# Patient Record
Sex: Female | Born: 1980 | Race: White | Hispanic: No | Marital: Married | Smoking: Never smoker
Health system: Southern US, Community
[De-identification: ages and names within clinical notes are randomized; demographics above are authoritative.]

## PROBLEM LIST (undated history)

## (undated) DIAGNOSIS — E079 Disorder of thyroid, unspecified: Secondary | ICD-10-CM

## (undated) DIAGNOSIS — F329 Major depressive disorder, single episode, unspecified: Secondary | ICD-10-CM

## (undated) DIAGNOSIS — F32A Depression, unspecified: Secondary | ICD-10-CM

## (undated) DIAGNOSIS — M069 Rheumatoid arthritis, unspecified: Secondary | ICD-10-CM

## (undated) DIAGNOSIS — M1990 Unspecified osteoarthritis, unspecified site: Secondary | ICD-10-CM

## (undated) DIAGNOSIS — C73 Malignant neoplasm of thyroid gland: Secondary | ICD-10-CM

## (undated) DIAGNOSIS — K219 Gastro-esophageal reflux disease without esophagitis: Secondary | ICD-10-CM

## (undated) HISTORY — DX: Malignant neoplasm of thyroid gland: C73

## (undated) HISTORY — DX: Gastro-esophageal reflux disease without esophagitis: K21.9

## (undated) HISTORY — PX: TYMPANOPLASTY: SHX33

## (undated) HISTORY — PX: THYROID SURGERY: SHX805

## (undated) HISTORY — DX: Rheumatoid arthritis, unspecified: M06.9

## (undated) HISTORY — PX: OTHER SURGICAL HISTORY: SHX169

---

## 2016-06-03 ENCOUNTER — Emergency Department (HOSPITAL_COMMUNITY)
Admission: EM | Admit: 2016-06-03 | Discharge: 2016-06-03 | Disposition: A | Discharge status: Home / Self Care | Payer: BLUE CROSS/BLUE SHIELD | Attending: Emergency Medicine | Admitting: Emergency Medicine

## 2016-06-03 ENCOUNTER — Encounter (HOSPITAL_COMMUNITY): Payer: Self-pay | Admitting: Emergency Medicine

## 2016-06-03 DIAGNOSIS — Y939 Activity, unspecified: Secondary | ICD-10-CM | POA: Insufficient documentation

## 2016-06-03 DIAGNOSIS — M542 Cervicalgia: Secondary | ICD-10-CM | POA: Insufficient documentation

## 2016-06-03 DIAGNOSIS — M549 Dorsalgia, unspecified: Secondary | ICD-10-CM | POA: Diagnosis not present

## 2016-06-03 DIAGNOSIS — Y92410 Unspecified street and highway as the place of occurrence of the external cause: Secondary | ICD-10-CM | POA: Diagnosis not present

## 2016-06-03 DIAGNOSIS — Y999 Unspecified external cause status: Secondary | ICD-10-CM | POA: Insufficient documentation

## 2016-06-03 HISTORY — DX: Depression, unspecified: F32.A

## 2016-06-03 HISTORY — DX: Unspecified osteoarthritis, unspecified site: M19.90

## 2016-06-03 HISTORY — DX: Disorder of thyroid, unspecified: E07.9

## 2016-06-03 HISTORY — DX: Major depressive disorder, single episode, unspecified: F32.9

## 2016-06-03 MED ORDER — DICLOFENAC 18 MG PO CAPS: 18.0000 mg | ORAL_CAPSULE | Freq: Three times a day (TID) | ORAL | 0 refills | Status: DC | PRN

## 2016-06-03 MED ORDER — LIDOCAINE 5 % EX PTCH: 1.0000 | MEDICATED_PATCH | CUTANEOUS | 0 refills | Status: DC

## 2016-06-03 MED ORDER — METHOCARBAMOL 500 MG PO TABS: 500.0000 mg | ORAL_TABLET | Freq: Two times a day (BID) | ORAL | 0 refills | Status: DC
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
## 2016-06-03 NOTE — Discharge Instructions (Signed)
Expect your soreness to increase over the next 2-3 days. Take it easy, but do not lay around too much as this may make any stiffness worse. Take 500 mg of naproxen every 12 hours or 800 mg of ibuprofen every 8 hours for the next 3 days (if ibuprofen or naproxen are not an option, you may take 18 mg of diclofenac 3 times a day for the next 3 days). Take these medications with food to avoid upset stomach. Robaxin is a muscle relaxer and may help loosen stiff muscles. Do not take the Robaxin while driving or performing other dangerous activities. Be sure to perform the attached exercises starting with three times a week and working up to performing them daily. This is an essential part of preventing long term problems. Follow up with a primary care provider for any future management of these complaints.
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
## 2016-08-31 DIAGNOSIS — M0579 Rheumatoid arthritis with rheumatoid factor of multiple sites without organ or systems involvement: Secondary | ICD-10-CM | POA: Diagnosis not present

## 2016-08-31 DIAGNOSIS — G43009 Migraine without aura, not intractable, without status migrainosus: Secondary | ICD-10-CM | POA: Diagnosis not present

## 2016-09-13 DIAGNOSIS — M0579 Rheumatoid arthritis with rheumatoid factor of multiple sites without organ or systems involvement: Secondary | ICD-10-CM | POA: Diagnosis not present

## 2016-10-12 DIAGNOSIS — M0579 Rheumatoid arthritis with rheumatoid factor of multiple sites without organ or systems involvement: Secondary | ICD-10-CM | POA: Diagnosis not present

## 2016-10-12 DIAGNOSIS — Z79899 Other long term (current) drug therapy: Secondary | ICD-10-CM | POA: Diagnosis not present

## 2016-11-01 DIAGNOSIS — M0579 Rheumatoid arthritis with rheumatoid factor of multiple sites without organ or systems involvement: Secondary | ICD-10-CM | POA: Diagnosis not present

## 2016-11-01 DIAGNOSIS — G43009 Migraine without aura, not intractable, without status migrainosus: Secondary | ICD-10-CM | POA: Diagnosis not present

## 2016-12-07 DIAGNOSIS — M0579 Rheumatoid arthritis with rheumatoid factor of multiple sites without organ or systems involvement: Secondary | ICD-10-CM | POA: Diagnosis not present

## 2016-12-07 DIAGNOSIS — Z79899 Other long term (current) drug therapy: Secondary | ICD-10-CM | POA: Diagnosis not present

## 2017-02-01 DIAGNOSIS — M0579 Rheumatoid arthritis with rheumatoid factor of multiple sites without organ or systems involvement: Secondary | ICD-10-CM | POA: Diagnosis not present

## 2017-02-01 DIAGNOSIS — G43009 Migraine without aura, not intractable, without status migrainosus: Secondary | ICD-10-CM | POA: Diagnosis not present

## 2017-02-04 DIAGNOSIS — M0579 Rheumatoid arthritis with rheumatoid factor of multiple sites without organ or systems involvement: Secondary | ICD-10-CM | POA: Diagnosis not present

## 2017-02-04 DIAGNOSIS — Z79899 Other long term (current) drug therapy: Secondary | ICD-10-CM | POA: Diagnosis not present

## 2017-02-17 DIAGNOSIS — G43009 Migraine without aura, not intractable, without status migrainosus: Secondary | ICD-10-CM | POA: Diagnosis not present

## 2017-02-17 DIAGNOSIS — M0579 Rheumatoid arthritis with rheumatoid factor of multiple sites without organ or systems involvement: Secondary | ICD-10-CM | POA: Diagnosis not present

## 2017-04-08 DIAGNOSIS — Z79899 Other long term (current) drug therapy: Secondary | ICD-10-CM | POA: Diagnosis not present

## 2017-04-08 DIAGNOSIS — M0579 Rheumatoid arthritis with rheumatoid factor of multiple sites without organ or systems involvement: Secondary | ICD-10-CM | POA: Diagnosis not present

## 2017-04-14 DIAGNOSIS — F4321 Adjustment disorder with depressed mood: Secondary | ICD-10-CM | POA: Diagnosis not present

## 2017-04-21 DIAGNOSIS — F4321 Adjustment disorder with depressed mood: Secondary | ICD-10-CM | POA: Diagnosis not present

## 2017-04-28 DIAGNOSIS — F4321 Adjustment disorder with depressed mood: Secondary | ICD-10-CM | POA: Diagnosis not present

## 2017-05-11 DIAGNOSIS — M0579 Rheumatoid arthritis with rheumatoid factor of multiple sites without organ or systems involvement: Secondary | ICD-10-CM | POA: Diagnosis not present

## 2017-05-12 DIAGNOSIS — F4321 Adjustment disorder with depressed mood: Secondary | ICD-10-CM | POA: Diagnosis not present

## 2017-06-03 DIAGNOSIS — M0579 Rheumatoid arthritis with rheumatoid factor of multiple sites without organ or systems involvement: Secondary | ICD-10-CM | POA: Diagnosis not present

## 2017-06-30 DIAGNOSIS — F4321 Adjustment disorder with depressed mood: Secondary | ICD-10-CM | POA: Diagnosis not present

## 2017-07-12 DIAGNOSIS — F4321 Adjustment disorder with depressed mood: Secondary | ICD-10-CM | POA: Diagnosis not present

## 2017-07-29 DIAGNOSIS — M0579 Rheumatoid arthritis with rheumatoid factor of multiple sites without organ or systems involvement: Secondary | ICD-10-CM | POA: Diagnosis not present

## 2017-08-02 DIAGNOSIS — F4321 Adjustment disorder with depressed mood: Secondary | ICD-10-CM | POA: Diagnosis not present

## 2017-08-11 DIAGNOSIS — F4321 Adjustment disorder with depressed mood: Secondary | ICD-10-CM | POA: Diagnosis not present

## 2017-08-11 DIAGNOSIS — M7989 Other specified soft tissue disorders: Secondary | ICD-10-CM | POA: Diagnosis not present

## 2017-08-11 DIAGNOSIS — M0579 Rheumatoid arthritis with rheumatoid factor of multiple sites without organ or systems involvement: Secondary | ICD-10-CM | POA: Diagnosis not present

## 2017-09-23 DIAGNOSIS — Z79899 Other long term (current) drug therapy: Secondary | ICD-10-CM | POA: Diagnosis not present

## 2017-09-23 DIAGNOSIS — M0579 Rheumatoid arthritis with rheumatoid factor of multiple sites without organ or systems involvement: Secondary | ICD-10-CM | POA: Diagnosis not present

## 2017-11-10 DIAGNOSIS — F4321 Adjustment disorder with depressed mood: Secondary | ICD-10-CM | POA: Diagnosis not present

## 2017-11-18 DIAGNOSIS — M0579 Rheumatoid arthritis with rheumatoid factor of multiple sites without organ or systems involvement: Secondary | ICD-10-CM | POA: Diagnosis not present

## 2017-11-29 DIAGNOSIS — F4321 Adjustment disorder with depressed mood: Secondary | ICD-10-CM | POA: Diagnosis not present

## 2017-12-08 DIAGNOSIS — M0579 Rheumatoid arthritis with rheumatoid factor of multiple sites without organ or systems involvement: Secondary | ICD-10-CM | POA: Diagnosis not present

## 2017-12-13 DIAGNOSIS — F4321 Adjustment disorder with depressed mood: Secondary | ICD-10-CM | POA: Diagnosis not present

## 2018-01-13 DIAGNOSIS — M0579 Rheumatoid arthritis with rheumatoid factor of multiple sites without organ or systems involvement: Secondary | ICD-10-CM | POA: Diagnosis not present

## 2018-02-09 DIAGNOSIS — F4321 Adjustment disorder with depressed mood: Secondary | ICD-10-CM | POA: Diagnosis not present

## 2018-02-16 DIAGNOSIS — F4321 Adjustment disorder with depressed mood: Secondary | ICD-10-CM | POA: Diagnosis not present

## 2018-03-02 DIAGNOSIS — F4321 Adjustment disorder with depressed mood: Secondary | ICD-10-CM | POA: Diagnosis not present

## 2018-03-14 DIAGNOSIS — E039 Hypothyroidism, unspecified: Secondary | ICD-10-CM | POA: Diagnosis not present

## 2018-03-14 DIAGNOSIS — Z79899 Other long term (current) drug therapy: Secondary | ICD-10-CM | POA: Diagnosis not present

## 2018-03-14 DIAGNOSIS — M0579 Rheumatoid arthritis with rheumatoid factor of multiple sites without organ or systems involvement: Secondary | ICD-10-CM | POA: Diagnosis not present

## 2018-03-23 DIAGNOSIS — F4321 Adjustment disorder with depressed mood: Secondary | ICD-10-CM | POA: Diagnosis not present

## 2018-04-19 DIAGNOSIS — Z803 Family history of malignant neoplasm of breast: Secondary | ICD-10-CM | POA: Diagnosis not present

## 2018-04-19 DIAGNOSIS — Z1231 Encounter for screening mammogram for malignant neoplasm of breast: Secondary | ICD-10-CM | POA: Diagnosis not present

## 2018-05-09 DIAGNOSIS — M0579 Rheumatoid arthritis with rheumatoid factor of multiple sites without organ or systems involvement: Secondary | ICD-10-CM | POA: Diagnosis not present

## 2018-06-19 DIAGNOSIS — M0579 Rheumatoid arthritis with rheumatoid factor of multiple sites without organ or systems involvement: Secondary | ICD-10-CM | POA: Diagnosis not present

## 2018-06-29 ENCOUNTER — Encounter (INDEPENDENT_AMBULATORY_CARE_PROVIDER_SITE_OTHER): Payer: Self-pay

## 2018-07-07 DIAGNOSIS — M0579 Rheumatoid arthritis with rheumatoid factor of multiple sites without organ or systems involvement: Secondary | ICD-10-CM | POA: Diagnosis not present

## 2018-07-13 ENCOUNTER — Encounter (INDEPENDENT_AMBULATORY_CARE_PROVIDER_SITE_OTHER): Payer: Self-pay | Admitting: Family Medicine

## 2018-07-13 ENCOUNTER — Ambulatory Visit (INDEPENDENT_AMBULATORY_CARE_PROVIDER_SITE_OTHER): Payer: BLUE CROSS/BLUE SHIELD | Admitting: Family Medicine

## 2018-07-13 VITALS — BP 141/84 | HR 98 | Temp 98.0°F | Ht 66.0 in | Wt 213.0 lb

## 2018-07-13 DIAGNOSIS — M069 Rheumatoid arthritis, unspecified: Secondary | ICD-10-CM | POA: Diagnosis not present

## 2018-07-13 DIAGNOSIS — Z0289 Encounter for other administrative examinations: Secondary | ICD-10-CM

## 2018-07-13 DIAGNOSIS — R5383 Other fatigue: Secondary | ICD-10-CM | POA: Diagnosis not present

## 2018-07-13 DIAGNOSIS — Z9189 Other specified personal risk factors, not elsewhere classified: Secondary | ICD-10-CM | POA: Diagnosis not present

## 2018-07-13 DIAGNOSIS — E559 Vitamin D deficiency, unspecified: Secondary | ICD-10-CM | POA: Diagnosis not present

## 2018-07-13 DIAGNOSIS — Z6834 Body mass index (BMI) 34.0-34.9, adult: Secondary | ICD-10-CM

## 2018-07-13 DIAGNOSIS — F418 Other specified anxiety disorders: Secondary | ICD-10-CM | POA: Diagnosis not present

## 2018-07-13 DIAGNOSIS — E669 Obesity, unspecified: Secondary | ICD-10-CM

## 2018-07-13 DIAGNOSIS — R0602 Shortness of breath: Secondary | ICD-10-CM

## 2018-07-15 ENCOUNTER — Encounter (INDEPENDENT_AMBULATORY_CARE_PROVIDER_SITE_OTHER): Payer: Self-pay | Admitting: Family Medicine

## 2018-07-15 LAB — T4, FREE: Free T4: 1.64 ng/dL (ref 0.82–1.77)

## 2018-07-15 LAB — VITAMIN B12: Vitamin B-12: >2000 pg/mL — ABNORMAL HIGH (ref 232–1245)

## 2018-07-15 LAB — COMPREHENSIVE METABOLIC PANEL
ALBUMIN: 4.3 g/dL (ref 3.8–4.8)
ALT: 18 IU/L (ref 0–32)
AST: 17 IU/L (ref 0–40)
Albumin/Globulin Ratio: 1.6 (ref 1.2–2.2)
Alkaline Phosphatase: 98 IU/L (ref 39–117)
BILIRUBIN TOTAL: 0.4 mg/dL (ref 0.0–1.2)
BUN/Creatinine Ratio: 13 (ref 9–23)
BUN: 10 mg/dL (ref 6–20)
CHLORIDE: 102 mmol/L (ref 96–106)
CO2: 19 mmol/L — ABNORMAL LOW (ref 20–29)
Calcium: 8.5 mg/dL — ABNORMAL LOW (ref 8.7–10.2)
Creatinine, Ser: 0.76 mg/dL (ref 0.57–1.00)
GFR, EST AFRICAN AMERICAN: 116 mL/min/{1.73_m2} (ref >59)
GFR, EST NON AFRICAN AMERICAN: 101 mL/min/{1.73_m2} (ref >59)
Globulin, Total: 2.7 g/dL (ref 1.5–4.5)
Glucose: 79 mg/dL (ref 65–99)
Potassium: 3.9 mmol/L (ref 3.5–5.2)
Sodium: 140 mmol/L (ref 134–144)
TOTAL PROTEIN: 7 g/dL (ref 6.0–8.5)

## 2018-07-15 LAB — CBC WITH DIFFERENTIAL
BASOS: 1 %
Basophils Absolute: 0 10*3/uL (ref 0.0–0.2)
EOS (ABSOLUTE): 0.1 10*3/uL (ref 0.0–0.4)
Eos: 2 %
Hematocrit: 38.1 % (ref 34.0–46.6)
Hemoglobin: 13.2 g/dL (ref 11.1–15.9)
IMMATURE GRANS (ABS): 0 10*3/uL (ref 0.0–0.1)
Immature Granulocytes: 0 %
LYMPHS ABS: 2 10*3/uL (ref 0.7–3.1)
LYMPHS: 28 %
MCH: 30.6 pg (ref 26.6–33.0)
MCHC: 34.6 g/dL (ref 31.5–35.7)
MCV: 88 fL (ref 79–97)
Monocytes Absolute: 0.4 10*3/uL (ref 0.1–0.9)
Monocytes: 6 %
NEUTROS ABS: 4.7 10*3/uL (ref 1.4–7.0)
NEUTROS PCT: 63 %
RBC: 4.31 x10E6/uL (ref 3.77–5.28)
RDW: 11.9 % (ref 11.7–15.4)
WBC: 7.3 10*3/uL (ref 3.4–10.8)

## 2018-07-15 LAB — HEMOGLOBIN A1C
ESTIMATED AVERAGE GLUCOSE: 103 mg/dL
HEMOGLOBIN A1C: 5.2 % (ref 4.8–5.6)

## 2018-07-15 LAB — LIPID PANEL WITH LDL/HDL RATIO
Cholesterol, Total: 168 mg/dL (ref 100–199)
HDL: 40 mg/dL (ref >39)
LDL Calculated: 117 mg/dL — ABNORMAL HIGH (ref 0–99)
LDl/HDL Ratio: 2.9 ratio (ref 0.0–3.2)
Triglycerides: 56 mg/dL (ref 0–149)
VLDL Cholesterol Cal: 11 mg/dL (ref 5–40)

## 2018-07-15 LAB — FOLATE: Folate: >20 ng/mL (ref >3.0)

## 2018-07-15 LAB — TSH: TSH: 0.477 u[IU]/mL (ref 0.450–4.500)

## 2018-07-15 LAB — INSULIN, RANDOM: INSULIN: 15.4 u[IU]/mL (ref 2.6–24.9)

## 2018-07-15 LAB — VITAMIN D 25 HYDROXY (VIT D DEFICIENCY, FRACTURES): Vit D, 25-Hydroxy: 56.7 ng/mL (ref 30.0–100.0)

## 2018-07-15 LAB — T3: T3 TOTAL: 116 ng/dL (ref 71–180)

## 2018-07-16 NOTE — Progress Notes (Signed)
.  Office: [phone]  /  Fax: [phone]   HPI:   Chief Complaint: OBESITY  Briana Brown (MR# [id number]) is a 38 y.o. female who presents on 07/13/2018 for obesity evaluation and treatment. Current BMI is Body mass index is 34.38 kg/m.Marland Kitchen Briana Brown has struggled with obesity for years and has been unsuccessful in either losing weight or maintaining long term weight loss. Briana Brown attended our information session and states she is currently in the action stage of change and ready to dedicate time achieving and maintaining a healthier weight.   Briana Brown heard about our clinic from a friend.  Briana Brown states her family eats meals together she thinks her family will eat healthier with  her her desired weight loss is 38-63 lbs she started gaining weight in 2014-post nursing her heaviest weight ever was 220 lbs she is a picky eater and doesn't like to eat healthier foods  she has significant food cravings issues  she snacks frequently in the evenings she skips meals frequently she is frequently drinking liquids with calories she frequently makes poor food choices she struggles with emotional eating    Fatigue Briana Brown feels her energy is lower than it should be. This has worsened with weight gain and has not worsened recently. Briana Brown admits to daytime somnolence and  admits to waking up still tired. Patient is at risk for obstructive sleep apnea. Patent has a history of symptoms of daytime fatigue. Patient generally gets 7 hours of sleep per night, and states they generally have generally restful sleep. Snoring is present. Apneic episodes are not present. Epworth Sleepiness Score is 11.  Dyspnea on exertion Briana Brown notes increasing shortness of breath with exercising and seems to be worsening over time with weight gain. She notes getting out of breath sooner with activity than she used to. This has not gotten worse recently. Briana Brown denies orthopnea.  Rheumatoid Arthritis Briana Brown has a  diagnosis of rheumatoid arthritis. She sees Dr. Georgetta Haber at [facility]. She notes having increased fatigue.  Vitamin D Deficiency Briana Brown has a diagnosis of vitamin D deficiency. She is OTC Vit D supplement and denies nausea, vomiting or muscle weakness.  At risk for osteopenia and osteoporosis Briana Brown is at higher risk of osteopenia and osteoporosis due to vitamin D deficiency.   Depression with Anxiety Briana Brown is on Wellbutrin XL, and her symptoms are managed and controlled. Briana Brown struggles with emotional eating and using food for comfort to the extent that it is negatively impacting her health. She often snacks when she is not hungry. Briana Brown sometimes feels she is out of control and then feels guilty that she made poor food choices. She has been working on behavior modification techniques to help reduce her emotional eating and has been somewhat successful. She shows no sign of suicidal or homicidal ideations.  Depression Screen Briana Brown's Food and Mood (modified PHQ-9) score was  Depression screen PHQ 2/9 07/13/2018  Decreased Interest 2  Down, Depressed, Hopeless 1  PHQ - 2 Score 3  Altered sleeping 1  Tired, decreased energy 2  Change in appetite 3  Feeling bad or failure about yourself  0  Trouble concentrating 2  Moving slowly or fidgety/restless 0  Suicidal thoughts 0  PHQ-9 Score 11    ASSESSMENT AND PLAN:  Other fatigue - Plan: EKG 12-Lead, Vitamin B12, CBC With Differential, Comprehensive metabolic panel, Folate, Hemoglobin A1c, Insulin, random, T3, T4, free, TSH  Shortness of breath on exertion - Plan: Lipid Panel With LDL/HDL Ratio  Rheumatoid arthritis, involving  unspecified site, unspecified rheumatoid factor presence ([place]) - Plan: VITAMIN D 25 Hydroxy (Vit-D Deficiency, Fractures)  Depression with anxiety  At risk for osteoporosis  Class 1 obesity with serious comorbidity and body mass index (BMI) of 34.0 to 34.9 in adult, unspecified obesity  type  PLAN:  Fatigue Briana Brown was informed that her fatigue may be related to obesity, depression or many other causes. Labs will be ordered, and in the meanwhile Briana Brown has agreed to work on diet, exercise and weight loss to help with fatigue. Proper sleep hygiene was discussed including the need for 7-8 hours of quality sleep each night. A sleep study was not ordered based on symptoms and Epworth score.  Dyspnea on exertion Briana Brown's shortness of breath appears to be obesity related and exercise induced. She has agreed to work on weight loss and gradually increase exercise to treat her exercise induced shortness of breath. If Briana Brown follows our instructions and loses weight without improvement of her shortness of breath, we will plan to refer to pulmonology. We will monitor this condition regularly. [name] agrees to this plan.  Rheumatoid Arthritis Briana Brown will follow up with rheumatology as needed, and she agrees to follow up with our clinic in 2 weeks.  Vitamin D Deficiency Briana Brown was informed that low vitamin D levels contributes to fatigue and are associated with obesity, breast, and colon cancer. Briana Brown agrees to continue taking OTC Vit D supplement and will follow up for routine testing of vitamin D, at least 2-3 times per year. She was informed of the risk of over-replacement of vitamin D and agrees to not increase her dose unless she discusses this with [name] first. We will check Vit D level today. Briana Brown agrees to follow up with our clinic in 2 weeks.  At risk for osteopenia and osteoporosis Briana Brown was given extended (15 minutes) osteoporosis prevention counseling today. Briana Brown is at risk for osteopenia and osteoporsis due to her vitamin D deficiency. She was encouraged to take her vitamin D and follow her higher calcium diet and increase strengthening exercise to help strengthen her bones and decrease her risk of osteopenia and osteoporosis.  Depression with Anxiety We discussed  behavior modification techniques today to help Briana Brown deal with her anxiety, emotional eating, and depression. Briana Brown agrees to continue taking Wellbutrin XL and she will follow up with her primary care physician as needed. Briana Brown agrees to follow up with our clinic in 2 weeks.  Depression Screen Briana Brown had a moderately positive depression screening. Depression is commonly associated with obesity and often results in emotional eating behaviors. We will monitor this closely and work on CBT to help improve the non-hunger eating patterns. Referral to Psychology may be required if no improvement is seen as she continues in our clinic.  Obesity Briana Brown is currently in the action stage of change and her goal is to continue with weight loss efforts She has agreed to follow the Category 2 plan + 100 calories Briana Brown has been instructed to work up to a goal of 150 minutes of combined cardio and strengthening exercise per week for weight loss and overall health benefits. We discussed the following Behavioral Modification Strategies today: increasing lean protein intake, increasing vegetables,  work on meal planning and easy cooking plans, and planning for success  Briana Brown has agreed to follow up with our clinic in 2 weeks. She was informed of the importance of frequent follow up visits to maximize her success with intensive lifestyle modifications for her multiple health conditions. She was informed  we would discuss her lab results at her next visit unless there is a critical issue that needs to be addressed sooner. Briana Brown agreed to keep her next visit at the agreed upon time to discuss these results.  ALLERGIES: Allergies  Allergen Reactions  . Shellfish Allergy Nausea And Vomiting  . Soy Allergy   . Sulfa Antibiotics Nausea And Vomiting    MEDICATIONS: Current Outpatient Medications on File Prior to Visit  Medication Sig Dispense Refill  . b complex vitamins tablet Take 1 tablet by mouth daily.     . Biotin 5000 MCG TABS Take 1 tablet by mouth daily.    Marland Kitchen buPROPion (WELLBUTRIN XL) 150 MG 24 hr tablet Take 150 mg by mouth daily.    . celecoxib (CELEBREX) 200 MG capsule Take 200 mg by mouth 2 (two) times daily.    . cholecalciferol (VITAMIN D3) 25 MCG (1000 UT) tablet Take 1,000 Units by mouth daily.    . folic acid (FOLVITE) 1 MG tablet Take 1 mg by mouth daily.    . Golimumab 50 MG/0.5ML SOAJ Inject into the skin. 2 mg/kg inj every 8 weeks    . Hyaluronic Acid-Vitamin C (HYALURONIC ACID PO) Take 120 mg by mouth daily.    Marland Kitchen levothyroxine (SYNTHROID, LEVOTHROID) 150 MCG tablet Take 150 mcg by mouth daily before breakfast.    . loratadine (CLARITIN) 10 MG tablet Take 10 mg by mouth daily.    . Magnesium 200 MG TABS Take 1 tablet by mouth daily.    . Melatonin 5 MG TABS Take 1 tablet by mouth daily.    . methotrexate 2.5 MG tablet Take by mouth once a week.    . Multiple Vitamins-Minerals (MULTIVITAMIN WITH MINERALS) tablet Take 1 tablet by mouth daily.     No current facility-administered medications on file prior to visit.     PAST MEDICAL HISTORY: Past Medical History:  Diagnosis Date  . Arthritis   . Depression   . Depression   . GERD (gastroesophageal reflux disease)   . Rheumatoid arthritis ([place])   . Thyroid cancer ([place])   . Thyroid disease     PAST SURGICAL HISTORY: Past Surgical History:  Procedure Laterality Date  . THYROID SURGERY    . TYMPANOPLASTY    . uterine ablation      SOCIAL HISTORY: Social History   Tobacco Use  . Smoking status: Never Smoker  . Smokeless tobacco: Never Used  Substance Use Topics  . Alcohol use: Not Currently  . Drug use: Never    FAMILY HISTORY: Family History  Problem Relation Age of Onset  . Cancer Mother   . Thyroid disease Mother   . Hyperlipidemia Father   . Depression Father     ROS: Review of Systems  Constitutional: Positive for malaise/fatigue. Negative for weight loss.       + Trouble sleeping  HENT:  Positive for tinnitus.   Eyes:       + Wear glasses or contacts  Respiratory: Positive for shortness of breath (with exertion).   Cardiovascular: Negative for orthopnea.       + Very cold feet or hands  Gastrointestinal: Positive for heartburn.  Musculoskeletal:       + Red or swollen joints  Skin:       + Dryness  Endo/Heme/Allergies: Bruises/bleeds easily.       + Heat/cold intolerance  Psychiatric/Behavioral: Positive for depression. Negative for suicidal ideas.    PHYSICAL EXAM: Blood pressure (!) 141/84, pulse 98, temperature  98 F (36.7 C), temperature source Oral, height 5\' 6"  (1.676 m), weight 213 lb (96.6 kg), SpO2 99 %. Body mass index is 34.38 kg/m. Physical Exam Vitals signs reviewed.  Constitutional:      Appearance: Normal appearance. She is obese.  HENT:     Head: Normocephalic and atraumatic.     Nose: Nose normal.  Eyes:     General: No scleral icterus.    Extraocular Movements: Extraocular movements intact.  Neck:     Musculoskeletal: Normal range of motion and neck supple.     Comments: No thyromegaly present Cardiovascular:     Rate and Rhythm: Normal rate and regular rhythm.     Pulses: Normal pulses.     Heart sounds: Normal heart sounds.  Pulmonary:     Effort: Pulmonary effort is normal. No respiratory distress.     Breath sounds: Normal breath sounds.  Abdominal:     Palpations: Abdomen is soft.     Tenderness: There is no abdominal tenderness.     Comments: + Obesity  Musculoskeletal: Normal range of motion.     Right lower leg: No edema.     Left lower leg: No edema.  Skin:    General: Skin is warm and dry.  Neurological:     Mental Status: She is alert and oriented to person, place, and time.     Coordination: Coordination normal.  Psychiatric:        Mood and Affect: Mood normal.        Behavior: Behavior normal.     RECENT LABS AND TESTS: BMET    Component Value Date/Time   NA 140 07/13/2018 1401   K 3.9 07/13/2018 1401    CL 102 07/13/2018 1401   CO2 19 (L) 07/13/2018 1401   GLUCOSE 79 07/13/2018 1401   BUN 10 07/13/2018 1401   CREATININE 0.76 07/13/2018 1401   CALCIUM 8.5 (L) 07/13/2018 1401   GFRNONAA 101 07/13/2018 1401   GFRAA 116 07/13/2018 1401   Lab Results  Component Value Date   HGBA1C 5.2 07/13/2018   Lab Results  Component Value Date   INSULIN 15.4 07/13/2018   CBC    Component Value Date/Time   WBC 7.3 07/13/2018 1401   RBC 4.31 07/13/2018 1401   HGB 13.2 07/13/2018 1401   HCT 38.1 07/13/2018 1401   MCV 88 07/13/2018 1401   MCH 30.6 07/13/2018 1401   MCHC 34.6 07/13/2018 1401   RDW 11.9 07/13/2018 1401   LYMPHSABS 2.0 07/13/2018 1401   EOSABS 0.1 07/13/2018 1401   BASOSABS 0.0 07/13/2018 1401   Iron/TIBC/Ferritin/ %Sat No results found for: IRON, TIBC, FERRITIN, IRONPCTSAT Lipid Panel     Component Value Date/Time   CHOL 168 07/13/2018 1401   TRIG 56 07/13/2018 1401   HDL 40 07/13/2018 1401   LDLCALC 117 (H) 07/13/2018 1401   Hepatic Function Panel     Component Value Date/Time   PROT 7.0 07/13/2018 1401   ALBUMIN 4.3 07/13/2018 1401   AST 17 07/13/2018 1401   ALT 18 07/13/2018 1401   ALKPHOS 98 07/13/2018 1401   BILITOT 0.4 07/13/2018 1401      Component Value Date/Time   TSH 0.477 07/13/2018 1401    ECG  shows NSR with a rate of 94 BPM INDIRECT CALORIMETER done today shows a VO2 of 226 and a REE of 1574. Her calculated basal metabolic rate is 0737 thus her basal metabolic rate is worse than expected.  OBESITY BEHAVIORAL INTERVENTION VISIT  Today's visit was # 1   Starting weight: 213 lbs Starting date: 07/13/2018 Today's weight : 213 lbs  Today's date: 07/13/2018 Total lbs lost to date: 0    ASK: We discussed the diagnosis of obesity with [name] today and Kylie agreed to give [name] permission to discuss obesity behavioral modification therapy today.  ASSESS: Michaila has the diagnosis of obesity and her BMI today is 34.4 Jodel is  in the action stage of change   ADVISE: Nayelie was educated on the multiple health risks of obesity as well as the benefit of weight loss to improve her health. She was advised of the need for long term treatment and the importance of lifestyle modifications to improve her current health and to decrease her risk of future health problems.  AGREE: Multiple dietary modification options and treatment options were discussed and  Maniyah agreed to follow the recommendations documented in the above note.  ARRANGE: Marquesa was educated on the importance of frequent visits to treat obesity as outlined per CMS and USPSTF guidelines and agreed to schedule her next follow up appointment today.   I, Trixie Dredge, am acting as transcriptionist for Ilene Qua, MD    I have reviewed the above documentation for accuracy and completeness, and I agree with the above. - Ilene Qua, MD

## 2018-07-27 ENCOUNTER — Ambulatory Visit (INDEPENDENT_AMBULATORY_CARE_PROVIDER_SITE_OTHER): Payer: BLUE CROSS/BLUE SHIELD | Admitting: Family Medicine

## 2018-07-27 ENCOUNTER — Encounter (INDEPENDENT_AMBULATORY_CARE_PROVIDER_SITE_OTHER): Payer: Self-pay | Admitting: Family Medicine

## 2018-07-27 VITALS — BP 112/77 | HR 78 | Temp 97.6°F | Ht 66.0 in | Wt 210.0 lb

## 2018-07-27 DIAGNOSIS — E7849 Other hyperlipidemia: Secondary | ICD-10-CM | POA: Diagnosis not present

## 2018-07-27 DIAGNOSIS — E038 Other specified hypothyroidism: Secondary | ICD-10-CM

## 2018-07-27 DIAGNOSIS — E8881 Metabolic syndrome: Secondary | ICD-10-CM

## 2018-07-27 DIAGNOSIS — E669 Obesity, unspecified: Secondary | ICD-10-CM

## 2018-07-27 DIAGNOSIS — Z6833 Body mass index (BMI) 33.0-33.9, adult: Secondary | ICD-10-CM

## 2018-07-29 NOTE — Progress Notes (Signed)
Office: [phone]  /  Fax: [phone]   HPI:   Chief Complaint: OBESITY Briana Brown is here to discuss her progress with her obesity treatment plan. She is on the Category 2 plan + 100 calories and is following her eating plan approximately 95 % of the time. She states she is exercising 0 minutes 0 times per week. Briana Brown reports hunger with all the food on the meal plan and would add an extra egg or extra yogurt. She has not done any exercise.  Her weight is 210 lb (95.3 kg) today and has had a weight loss of 3 pounds over a period of 2 weeks since her last visit. She has lost 3 lbs since starting treatment with [name].  Hyperlipidemia Briana Brown has hyperlipidemia and has been trying to improve her cholesterol levels with intensive lifestyle modification including a low saturated fat diet, exercise and weight loss. Her LDL is of 117 and she is not on medications. She denies any chest pain, claudication or myalgias.  Insulin Resistance Briana Brown has a diagnosis of insulin resistance based on her elevated fasting insulin level >5. Insulin is of 15.4 and Hgb A1c of 5.2. Although Briana Brown's blood glucose readings are still under good control, insulin resistance puts her at greater risk of metabolic syndrome and diabetes. She is not taking metformin currently and continues to work on diet and exercise to decrease risk of diabetes.  Hypothyroidism Briana Brown has a diagnosis of hypothyroidism. She is on levothyroxine. She denies hot or cold intolerance or palpitations.  ASSESSMENT AND PLAN:  Other hyperlipidemia  Insulin resistance  Other specified hypothyroidism  Class 1 obesity with serious comorbidity and body mass index (BMI) of 33.0 to 33.9 in adult, unspecified obesity type  PLAN:  Hyperlipidemia Briana Brown was informed of the American Heart Association Guidelines emphasizing intensive lifestyle modifications as the first line treatment for hyperlipidemia. We discussed many lifestyle  modifications today in depth, and Briana Brown will continue to work on decreasing saturated fats such as fatty red meat, butter and many fried foods. She will also increase vegetables and lean protein in her diet and continue to work on exercise and weight loss efforts. We will retest labs in 3 months. Briana Brown agrees to follow up with our clinic in 2 weeks.  Insulin Resistance Briana Brown will continue to work on weight loss, exercise, and decreasing simple carbohydrates in her diet to help decrease the risk of diabetes. We dicussed metformin including benefits and risks. She was informed that eating too many simple carbohydrates or too many calories at one sitting increases the likelihood of GI side effects. Briana Brown declined metformin for now and prescription was not written today. We will retest labs in 3 months. Briana Brown agrees to follow up with our clinic in 2 weeks as directed to monitor her progress.  Hypothyroidism Briana Brown was informed of the importance of good thyroid control to help with weight loss efforts. She was also informed that supertheraputic thyroid levels are dangerous and will not improve weight loss results. We will retest TSH in 3 months. Briana Brown agrees to follow up with our clinic in 2 weeks.  I spent > than 50% of the 25 minute visit on counseling as documented in the note.  Obesity Briana Brown is currently in the action stage of change. As such, her goal is to continue with weight loss efforts She has agreed to follow the Category 2 plan + 100 calories Briana Brown has been instructed to work up to a goal of 150 minutes of combined cardio and  strengthening exercise per week for weight loss and overall health benefits. We discussed the following Behavioral Modification Strategies today: increasing lean protein intake, increasing vegetables, work on meal planning and easy cooking plans, better snacking choices, and planning for success   Briana Brown has agreed to follow up with our clinic in 2  weeks. She was informed of the importance of frequent follow up visits to maximize her success with intensive lifestyle modifications for her multiple health conditions.  ALLERGIES: Allergies  Allergen Reactions  . Shellfish Allergy Nausea And Vomiting  . Soy Allergy   . Sulfa Antibiotics Nausea And Vomiting    MEDICATIONS: Current Outpatient Medications on File Prior to Visit  Medication Sig Dispense Refill  . b complex vitamins tablet Take 1 tablet by mouth daily.    . Biotin 5000 MCG TABS Take 1 tablet by mouth daily.    Marland Kitchen buPROPion (WELLBUTRIN XL) 150 MG 24 hr tablet Take 150 mg by mouth daily.    . celecoxib (CELEBREX) 200 MG capsule Take 200 mg by mouth 2 (two) times daily.    . cholecalciferol (VITAMIN D3) 25 MCG (1000 UT) tablet Take 1,000 Units by mouth daily.    . folic acid (FOLVITE) 1 MG tablet Take 1 mg by mouth daily.    . Golimumab 50 MG/0.5ML SOAJ Inject into the skin. 2 mg/kg inj every 8 weeks    . Hyaluronic Acid-Vitamin C (HYALURONIC ACID PO) Take 120 mg by mouth daily.    Marland Kitchen levothyroxine (SYNTHROID, LEVOTHROID) 150 MCG tablet Take 150 mcg by mouth daily before breakfast.    . loratadine (CLARITIN) 10 MG tablet Take 10 mg by mouth daily.    . Magnesium 200 MG TABS Take 1 tablet by mouth daily.    . Melatonin 5 MG TABS Take 1 tablet by mouth daily.    . methotrexate 2.5 MG tablet Take by mouth once a week.    . Multiple Vitamins-Minerals (MULTIVITAMIN WITH MINERALS) tablet Take 1 tablet by mouth daily.     No current facility-administered medications on file prior to visit.     PAST MEDICAL HISTORY: Past Medical History:  Diagnosis Date  . Arthritis   . Depression   . Depression   . GERD (gastroesophageal reflux disease)   . Rheumatoid arthritis ([place])   . Thyroid cancer ([place])   . Thyroid disease     PAST SURGICAL HISTORY: Past Surgical History:  Procedure Laterality Date  . THYROID SURGERY    . TYMPANOPLASTY    . uterine ablation      SOCIAL  HISTORY: Social History   Tobacco Use  . Smoking status: Never Smoker  . Smokeless tobacco: Never Used  Substance Use Topics  . Alcohol use: Not Currently  . Drug use: Never    FAMILY HISTORY: Family History  Problem Relation Age of Onset  . Cancer Mother   . Thyroid disease Mother   . Hyperlipidemia Father   . Depression Father     ROS: Review of Systems  Constitutional: Positive for weight loss.  Cardiovascular: Negative for chest pain, palpitations and claudication.  Musculoskeletal: Negative for myalgias.  Endo/Heme/Allergies:       Negative hot/cold intolerance    PHYSICAL EXAM: Blood pressure 112/77, pulse 78, temperature 97.6 F (36.4 C), temperature source Oral, height 5\' 6"  (1.676 m), weight 210 lb (95.3 kg), SpO2 99 %. Body mass index is 33.89 kg/m. Physical Exam Vitals signs reviewed.  Constitutional:      Appearance: Normal appearance. She is obese.  Cardiovascular:     Rate and Rhythm: Normal rate.     Pulses: Normal pulses.  Pulmonary:     Effort: Pulmonary effort is normal.     Breath sounds: Normal breath sounds.  Musculoskeletal: Normal range of motion.  Skin:    General: Skin is warm and dry.  Neurological:     Mental Status: She is alert and oriented to person, place, and time.  Psychiatric:        Mood and Affect: Mood normal.        Behavior: Behavior normal.     RECENT LABS AND TESTS: BMET    Component Value Date/Time   NA 140 07/13/2018 1401   K 3.9 07/13/2018 1401   CL 102 07/13/2018 1401   CO2 19 (L) 07/13/2018 1401   GLUCOSE 79 07/13/2018 1401   BUN 10 07/13/2018 1401   CREATININE 0.76 07/13/2018 1401   CALCIUM 8.5 (L) 07/13/2018 1401   GFRNONAA 101 07/13/2018 1401   GFRAA 116 07/13/2018 1401   Lab Results  Component Value Date   HGBA1C 5.2 07/13/2018   Lab Results  Component Value Date   INSULIN 15.4 07/13/2018   CBC    Component Value Date/Time   WBC 7.3 07/13/2018 1401   RBC 4.31 07/13/2018 1401   HGB 13.2  07/13/2018 1401   HCT 38.1 07/13/2018 1401   MCV 88 07/13/2018 1401   MCH 30.6 07/13/2018 1401   MCHC 34.6 07/13/2018 1401   RDW 11.9 07/13/2018 1401   LYMPHSABS 2.0 07/13/2018 1401   EOSABS 0.1 07/13/2018 1401   BASOSABS 0.0 07/13/2018 1401   Iron/TIBC/Ferritin/ %Sat No results found for: IRON, TIBC, FERRITIN, IRONPCTSAT Lipid Panel     Component Value Date/Time   CHOL 168 07/13/2018 1401   TRIG 56 07/13/2018 1401   HDL 40 07/13/2018 1401   LDLCALC 117 (H) 07/13/2018 1401   Hepatic Function Panel     Component Value Date/Time   PROT 7.0 07/13/2018 1401   ALBUMIN 4.3 07/13/2018 1401   AST 17 07/13/2018 1401   ALT 18 07/13/2018 1401   ALKPHOS 98 07/13/2018 1401   BILITOT 0.4 07/13/2018 1401      Component Value Date/Time   TSH 0.477 07/13/2018 1401      OBESITY BEHAVIORAL INTERVENTION VISIT  Today's visit was # 2   Starting weight: 213 lbs Starting date: 07/13/2018 Today's weight : 210 lbs  Today's date: 07/27/2018 Total lbs lost to date: 3    ASK: We discussed the diagnosis of obesity with Briana Brown today and [place] agreed to give [name] permission to discuss obesity behavioral modification therapy today.  ASSESS: Briana Brown has the diagnosis of obesity and her BMI today is 33.91 Briana Brown is in the action stage of change   ADVISE: Briana Brown was educated on the multiple health risks of obesity as well as the benefit of weight loss to improve her health. She was advised of the need for long term treatment and the importance of lifestyle modifications to improve her current health and to decrease her risk of future health problems.  AGREE: Multiple dietary modification options and treatment options were discussed and  Briana Brown agreed to follow the recommendations documented in the above note.  ARRANGE: Briana Brown was educated on the importance of frequent visits to treat obesity as outlined per CMS and USPSTF guidelines and agreed to schedule her next follow up  appointment today.  Wilhemena Durie, am acting as transcriptionist for Ilene Qua, MD  I have reviewed the  above documentation for accuracy and completeness, and I agree with the above. - Ilene Qua, MD

## 2018-08-09 ENCOUNTER — Ambulatory Visit (INDEPENDENT_AMBULATORY_CARE_PROVIDER_SITE_OTHER): Payer: Self-pay | Admitting: Physician Assistant

## 2018-08-10 ENCOUNTER — Ambulatory Visit (INDEPENDENT_AMBULATORY_CARE_PROVIDER_SITE_OTHER): Payer: BLUE CROSS/BLUE SHIELD | Admitting: Family Medicine

## 2018-08-10 ENCOUNTER — Encounter (INDEPENDENT_AMBULATORY_CARE_PROVIDER_SITE_OTHER): Payer: Self-pay | Admitting: Family Medicine

## 2018-08-10 VITALS — BP 131/78 | HR 74 | Temp 97.9°F | Ht 66.0 in | Wt 207.0 lb

## 2018-08-10 DIAGNOSIS — E7849 Other hyperlipidemia: Secondary | ICD-10-CM | POA: Diagnosis not present

## 2018-08-10 DIAGNOSIS — E559 Vitamin D deficiency, unspecified: Secondary | ICD-10-CM | POA: Diagnosis not present

## 2018-08-10 DIAGNOSIS — Z6833 Body mass index (BMI) 33.0-33.9, adult: Secondary | ICD-10-CM

## 2018-08-10 DIAGNOSIS — E669 Obesity, unspecified: Secondary | ICD-10-CM | POA: Diagnosis not present

## 2018-08-10 NOTE — Progress Notes (Signed)
Office: [phone]  /  Fax: [phone]   HPI:   Chief Complaint: OBESITY Briana Brown is here to discuss her progress with her obesity treatment plan. She is on the Category 2 plan + 100 calories and is following her eating plan approximately 95 % of the time. She states she is exercising 0 minutes 0 times per week. Briana Brown denies hunger with the plan. She notes slight decrease in vegetable intake this week secondary to busy schedule. she is feeling good, eating beef jerky and greek yogurt dip for snack.  Her weight is 207 lb (93.9 kg) today and has had a weight loss of 3 pounds over a period of 2 weeks since her last visit. She has lost 6 lbs since starting treatment with [name].  Vitamin D Deficiency Briana Brown has a diagnosis of vitamin D deficiency. She is currently taking OTC Vit D. She notes improving fatigue and denies nausea, vomiting or muscle weakness.  Hyperlipidemia Briana Brown has hyperlipidemia and has been trying to improve her cholesterol levels with intensive lifestyle modification including a low saturated fat diet, exercise and weight loss. She denies any chest pain, claudication or myalgias.  ASSESSMENT AND PLAN:  Vitamin D deficiency  Other hyperlipidemia  Class 1 obesity with serious comorbidity and body mass index (BMI) of 33.0 to 33.9 in adult, unspecified obesity type  PLAN:  Vitamin D Deficiency Briana Brown was informed that low vitamin D levels contributes to fatigue and are associated with obesity, breast, and colon cancer. Briana Brown agrees to continue taking OTC Vit D replacement and will follow up for routine testing of vitamin D, at least 2-3 times per year. She was informed of the risk of over-replacement of vitamin D and agrees to not increase her dose unless she discusses this with [name] first. Briana Brown agrees to follow up with our clinic in 2 weeks.  Hyperlipidemia Briana Brown was informed of the American Heart Association Guidelines emphasizing intensive lifestyle  modifications as the first line treatment for hyperlipidemia. We discussed many lifestyle modifications today in depth, and Briana Brown will continue to work on decreasing saturated fats such as fatty red meat, butter and many fried foods. She will also increase vegetables and lean protein in her diet and continue to work on exercise and weight loss efforts. We will repeat labs in early May. Briana Brown agrees to follow up with our clinic in 2 weeks.  I spent > than 50% of the 15 minute visit on counseling as documented in the note.  Obesity Briana Brown is currently in the action stage of change. As such, her goal is to continue with weight loss efforts She has agreed to follow the Category 2 plan + 100 calories Briana Brown has been instructed to work up to a goal of 150 minutes of combined cardio and strengthening exercise per week for weight loss and overall health benefits. We discussed the following Behavioral Modification Strategies today: increasing lean protein intake, increase H20 intake, work on meal planning and easy cooking plans, and planning for success   Briana Brown has agreed to follow up with our clinic in 2 weeks. She was informed of the importance of frequent follow up visits to maximize her success with intensive lifestyle modifications for her multiple health conditions.  ALLERGIES: Allergies  Allergen Reactions  . Shellfish Allergy Nausea And Vomiting  . Soy Allergy   . Sulfa Antibiotics Nausea And Vomiting    MEDICATIONS: Current Outpatient Medications on File Prior to Visit  Medication Sig Dispense Refill  . b complex vitamins tablet Take  1 tablet by mouth daily.    . Biotin 5000 MCG TABS Take 1 tablet by mouth daily.    Marland Kitchen buPROPion (WELLBUTRIN XL) 150 MG 24 hr tablet Take 150 mg by mouth daily.    . celecoxib (CELEBREX) 200 MG capsule Take 200 mg by mouth 2 (two) times daily.    . cholecalciferol (VITAMIN D3) 25 MCG (1000 UT) tablet Take 1,000 Units by mouth daily.    . folic acid  (FOLVITE) 1 MG tablet Take 1 mg by mouth daily.    . Golimumab 50 MG/0.5ML SOAJ Inject into the skin. 2 mg/kg inj every 8 weeks    . Hyaluronic Acid-Vitamin C (HYALURONIC ACID PO) Take 120 mg by mouth daily.    Marland Kitchen levothyroxine (SYNTHROID, LEVOTHROID) 150 MCG tablet Take 150 mcg by mouth daily before breakfast.    . loratadine (CLARITIN) 10 MG tablet Take 10 mg by mouth daily.    . Magnesium 200 MG TABS Take 1 tablet by mouth daily.    . Melatonin 5 MG TABS Take 1 tablet by mouth daily.    . methotrexate 2.5 MG tablet Take by mouth once a week.    . Multiple Vitamins-Minerals (MULTIVITAMIN WITH MINERALS) tablet Take 1 tablet by mouth daily.     No current facility-administered medications on file prior to visit.     PAST MEDICAL HISTORY: Past Medical History:  Diagnosis Date  . Arthritis   . Depression   . Depression   . GERD (gastroesophageal reflux disease)   . Rheumatoid arthritis ([place])   . Thyroid cancer ([place])   . Thyroid disease     PAST SURGICAL HISTORY: Past Surgical History:  Procedure Laterality Date  . THYROID SURGERY    . TYMPANOPLASTY    . uterine ablation      SOCIAL HISTORY: Social History   Tobacco Use  . Smoking status: Never Smoker  . Smokeless tobacco: Never Used  Substance Use Topics  . Alcohol use: Not Currently  . Drug use: Never    FAMILY HISTORY: Family History  Problem Relation Age of Onset  . Cancer Mother   . Thyroid disease Mother   . Hyperlipidemia Father   . Depression Father     ROS: Review of Systems  Constitutional: Positive for malaise/fatigue and weight loss.  Cardiovascular: Negative for chest pain and claudication.  Gastrointestinal: Negative for nausea and vomiting.  Musculoskeletal: Negative for myalgias.       Negative muscle weakness    PHYSICAL EXAM: Blood pressure 131/78, pulse 74, temperature 97.9 F (36.6 C), temperature source Oral, height 5\' 6"  (1.676 m), weight 207 lb (93.9 kg), SpO2 98 %. Body mass index  is 33.41 kg/m. Physical Exam Vitals signs reviewed.  Constitutional:      Appearance: Normal appearance. She is obese.  Cardiovascular:     Rate and Rhythm: Normal rate.     Pulses: Normal pulses.  Pulmonary:     Effort: Pulmonary effort is normal.     Breath sounds: Normal breath sounds.  Musculoskeletal: Normal range of motion.  Skin:    General: Skin is warm and dry.  Neurological:     Mental Status: She is alert and oriented to person, place, and time.  Psychiatric:        Mood and Affect: Mood normal.        Behavior: Behavior normal.     RECENT LABS AND TESTS: BMET    Component Value Date/Time   NA 140 07/13/2018 1401   K  3.9 07/13/2018 1401   CL 102 07/13/2018 1401   CO2 19 (L) 07/13/2018 1401   GLUCOSE 79 07/13/2018 1401   BUN 10 07/13/2018 1401   CREATININE 0.76 07/13/2018 1401   CALCIUM 8.5 (L) 07/13/2018 1401   GFRNONAA 101 07/13/2018 1401   GFRAA 116 07/13/2018 1401   Lab Results  Component Value Date   HGBA1C 5.2 07/13/2018   Lab Results  Component Value Date   INSULIN 15.4 07/13/2018   CBC    Component Value Date/Time   WBC 7.3 07/13/2018 1401   RBC 4.31 07/13/2018 1401   HGB 13.2 07/13/2018 1401   HCT 38.1 07/13/2018 1401   MCV 88 07/13/2018 1401   MCH 30.6 07/13/2018 1401   MCHC 34.6 07/13/2018 1401   RDW 11.9 07/13/2018 1401   LYMPHSABS 2.0 07/13/2018 1401   EOSABS 0.1 07/13/2018 1401   BASOSABS 0.0 07/13/2018 1401   Iron/TIBC/Ferritin/ %Sat No results found for: IRON, TIBC, FERRITIN, IRONPCTSAT Lipid Panel     Component Value Date/Time   CHOL 168 07/13/2018 1401   TRIG 56 07/13/2018 1401   HDL 40 07/13/2018 1401   LDLCALC 117 (H) 07/13/2018 1401   Hepatic Function Panel     Component Value Date/Time   PROT 7.0 07/13/2018 1401   ALBUMIN 4.3 07/13/2018 1401   AST 17 07/13/2018 1401   ALT 18 07/13/2018 1401   ALKPHOS 98 07/13/2018 1401   BILITOT 0.4 07/13/2018 1401      Component Value Date/Time   TSH 0.477 07/13/2018  1401      OBESITY BEHAVIORAL INTERVENTION VISIT  Today's visit was # 3   Starting weight: 213 lbs Starting date: 07/13/2018 Today's weight : 207 lbs  Today's date: 08/10/2018 Total lbs lost to date: 6    08/10/2018  Height 5\' 6"  (1.676 m)  Weight 207 lb (93.9 kg)  BMI (Calculated) 33.43  BLOOD PRESSURE - SYSTOLIC 503  BLOOD PRESSURE - DIASTOLIC 78   Body Fat % 54.6 %  Total Body Water (lbs) 78.6 lbs     ASK: We discussed the diagnosis of obesity with Briana Brown today and Briana Brown agreed to give [name] permission to discuss obesity behavioral modification therapy today.  ASSESS: Briana Brown has the diagnosis of obesity and her BMI today is 33.43 Briana Brown is in the action stage of change   ADVISE: Briana Brown was educated on the multiple health risks of obesity as well as the benefit of weight loss to improve her health. She was advised of the need for long term treatment and the importance of lifestyle modifications to improve her current health and to decrease her risk of future health problems.  AGREE: Multiple dietary modification options and treatment options were discussed and  Briana Brown agreed to follow the recommendations documented in the above note.  ARRANGE: Briana Brown was educated on the importance of frequent visits to treat obesity as outlined per CMS and USPSTF guidelines and agreed to schedule her next follow up appointment today.  I, Trixie Dredge, am acting as transcriptionist for Ilene Qua, MD  I have reviewed the above documentation for accuracy and completeness, and I agree with the above. - Ilene Qua, MD

## 2018-08-28 ENCOUNTER — Encounter (INDEPENDENT_AMBULATORY_CARE_PROVIDER_SITE_OTHER): Payer: Self-pay

## 2018-08-29 ENCOUNTER — Other Ambulatory Visit: Payer: Self-pay

## 2018-08-29 ENCOUNTER — Encounter (INDEPENDENT_AMBULATORY_CARE_PROVIDER_SITE_OTHER): Payer: Self-pay | Admitting: Physician Assistant

## 2018-08-29 ENCOUNTER — Ambulatory Visit (INDEPENDENT_AMBULATORY_CARE_PROVIDER_SITE_OTHER): Payer: BLUE CROSS/BLUE SHIELD | Admitting: Physician Assistant

## 2018-08-29 VITALS — BP 114/77 | HR 87 | Temp 97.8°F | Ht 66.0 in | Wt 205.0 lb

## 2018-08-29 DIAGNOSIS — E669 Obesity, unspecified: Secondary | ICD-10-CM

## 2018-08-29 DIAGNOSIS — E7849 Other hyperlipidemia: Secondary | ICD-10-CM | POA: Diagnosis not present

## 2018-08-29 DIAGNOSIS — Z6833 Body mass index (BMI) 33.0-33.9, adult: Secondary | ICD-10-CM

## 2018-08-30 NOTE — Progress Notes (Signed)
Office: [phone]  /  Fax: [phone]   HPI:   Chief Complaint: OBESITY Briana Brown is here to discuss her progress with her obesity treatment plan. She is on the Category 2 plan + 100 calories and is following her eating plan approximately 75% of the time. She states she is exercising 0 minutes 0 times per week. Briana Brown did well with weight loss. She reports that she is bored with meat currently. She would like additional meal options.  Her weight is 205 lb (93 kg) today and has had a weight loss of 2 pounds over a period of 3 weeks since her last visit. She has lost 8 lbs since starting treatment with [name].  Hyperlipidemia Briana Brown has hyperlipidemia and is currently on no medication. She has been trying to improve her cholesterol levels with intensive lifestyle modification including a low saturated fat diet, exercise and weight loss. She denies any chest pain, claudication or myalgias.  ASSESSMENT AND PLAN:  Other hyperlipidemia  Class 1 obesity with serious comorbidity and body mass index (BMI) of 33.0 to 33.9 in adult, unspecified obesity type  PLAN:  Hyperlipidemia Briana Brown was informed of the American Heart Association Guidelines emphasizing intensive lifestyle modifications as the first line treatment for hyperlipidemia. We discussed many lifestyle modifications today in depth, and Briana Brown will continue to work on decreasing saturated fats such as fatty red meat, butter and many fried foods. She will also increase vegetables and lean protein in her diet and continue to work on exercise and weight loss efforts.  I spent > than 50% of the 15 minute visit on counseling as documented in the note.  Obesity Briana Brown is currently in the action stage of change. As such, her goal is to continue with weight loss efforts. She has agreed to follow the Category 2 plan + 100 calories. Additional breakfast and lunch options were given. Briana Brown has been instructed to work up to a goal of 150  minutes of combined cardio and strengthening exercise per week for weight loss and overall health benefits. We discussed the following Behavioral Modification Strategies today: work on meal planning, easy cooking plans, and ways to avoid boredom eating  Briana Brown has agreed to follow-up with our clinic in 2 weeks. She was informed of the importance of frequent follow-up visits to maximize her success with intensive lifestyle modifications for her multiple health conditions.  ALLERGIES: Allergies  Allergen Reactions  . Shellfish Allergy Nausea And Vomiting  . Soy Allergy   . Sulfa Antibiotics Nausea And Vomiting    MEDICATIONS: Current Outpatient Medications on File Prior to Visit  Medication Sig Dispense Refill  . b complex vitamins tablet Take 1 tablet by mouth daily.    . Biotin 5000 MCG TABS Take 1 tablet by mouth daily.    Marland Kitchen buPROPion (WELLBUTRIN XL) 150 MG 24 hr tablet Take 150 mg by mouth daily.    . celecoxib (CELEBREX) 200 MG capsule Take 200 mg by mouth 2 (two) times daily.    . cholecalciferol (VITAMIN D3) 25 MCG (1000 UT) tablet Take 1,000 Units by mouth daily.    . folic acid (FOLVITE) 1 MG tablet Take 1 mg by mouth daily.    . Golimumab 50 MG/0.5ML SOAJ Inject into the skin. 2 mg/kg inj every 8 weeks    . Hyaluronic Acid-Vitamin C (HYALURONIC ACID PO) Take 120 mg by mouth daily.    Marland Kitchen levothyroxine (SYNTHROID, LEVOTHROID) 150 MCG tablet Take 150 mcg by mouth daily before breakfast.    .  loratadine (CLARITIN) 10 MG tablet Take 10 mg by mouth daily.    . Magnesium 200 MG TABS Take 1 tablet by mouth daily.    . Melatonin 5 MG TABS Take 1 tablet by mouth daily.    . methotrexate 2.5 MG tablet Take by mouth once a week.    . Multiple Vitamins-Minerals (MULTIVITAMIN WITH MINERALS) tablet Take 1 tablet by mouth daily.     No current facility-administered medications on file prior to visit.     PAST MEDICAL HISTORY: Past Medical History:  Diagnosis Date  . Arthritis   .  Depression   . Depression   . GERD (gastroesophageal reflux disease)   . Rheumatoid arthritis ([place])   . Thyroid cancer ([place])   . Thyroid disease     PAST SURGICAL HISTORY: Past Surgical History:  Procedure Laterality Date  . THYROID SURGERY    . TYMPANOPLASTY    . uterine ablation      SOCIAL HISTORY: Social History   Tobacco Use  . Smoking status: Never Smoker  . Smokeless tobacco: Never Used  Substance Use Topics  . Alcohol use: Not Currently  . Drug use: Never    FAMILY HISTORY: Family History  Problem Relation Age of Onset  . Cancer Mother   . Thyroid disease Mother   . Hyperlipidemia Father   . Depression Father    ROS: Review of Systems  Constitutional: Positive for weight loss.   PHYSICAL EXAM: Blood pressure 114/77, pulse 87, temperature 97.8 F (36.6 C), height 5\' 6"  (1.676 m), weight 205 lb (93 kg), SpO2 100 %. Body mass index is 33.09 kg/m. Physical Exam Vitals signs reviewed.  Constitutional:      Appearance: Normal appearance. She is obese.  Cardiovascular:     Rate and Rhythm: Normal rate.     Pulses: Normal pulses.  Pulmonary:     Effort: Pulmonary effort is normal.     Breath sounds: Normal breath sounds.  Musculoskeletal: Normal range of motion.  Skin:    General: Skin is warm and dry.  Neurological:     Mental Status: She is alert and oriented to person, place, and time.  Psychiatric:        Behavior: Behavior normal.   RECENT LABS AND TESTS: BMET    Component Value Date/Time   NA 140 07/13/2018 1401   K 3.9 07/13/2018 1401   CL 102 07/13/2018 1401   CO2 19 (L) 07/13/2018 1401   GLUCOSE 79 07/13/2018 1401   BUN 10 07/13/2018 1401   CREATININE 0.76 07/13/2018 1401   CALCIUM 8.5 (L) 07/13/2018 1401   GFRNONAA 101 07/13/2018 1401   GFRAA 116 07/13/2018 1401   Lab Results  Component Value Date   HGBA1C 5.2 07/13/2018   Lab Results  Component Value Date   INSULIN 15.4 07/13/2018   CBC    Component Value Date/Time    WBC 7.3 07/13/2018 1401   RBC 4.31 07/13/2018 1401   HGB 13.2 07/13/2018 1401   HCT 38.1 07/13/2018 1401   MCV 88 07/13/2018 1401   MCH 30.6 07/13/2018 1401   MCHC 34.6 07/13/2018 1401   RDW 11.9 07/13/2018 1401   LYMPHSABS 2.0 07/13/2018 1401   EOSABS 0.1 07/13/2018 1401   BASOSABS 0.0 07/13/2018 1401   Iron/TIBC/Ferritin/ %Sat No results found for: IRON, TIBC, FERRITIN, IRONPCTSAT Lipid Panel     Component Value Date/Time   CHOL 168 07/13/2018 1401   TRIG 56 07/13/2018 1401   HDL 40 07/13/2018 1401  LDLCALC 117 (H) 07/13/2018 1401   Hepatic Function Panel     Component Value Date/Time   PROT 7.0 07/13/2018 1401   ALBUMIN 4.3 07/13/2018 1401   AST 17 07/13/2018 1401   ALT 18 07/13/2018 1401   ALKPHOS 98 07/13/2018 1401   BILITOT 0.4 07/13/2018 1401      Component Value Date/Time   TSH 0.477 07/13/2018 1401   Results for Crespin, Addysyn "KATIE" (MRN [id number]) as of 08/30/2018 07:46  Ref. Range 07/13/2018 14:01  Vitamin D, 25-Hydroxy Latest Ref Range: 30.0 - 100.0 ng/mL 56.7   OBESITY BEHAVIORAL INTERVENTION VISIT  Today's visit was #4   Starting weight: 213 lbs Starting date: 07/13/2018 Today's weight: 205 lbs  Today's date: 08/29/2018 Total lbs lost to date: 8    08/29/2018  Height 5\' 6"  (1.676 m)  Weight 205 lb (93 kg)  BMI (Calculated) 33.1  BLOOD PRESSURE - SYSTOLIC 286  BLOOD PRESSURE - DIASTOLIC 77   Body Fat % 38.1 %  Total Body Water (lbs) 79.2 lbs   ASK: We discussed the diagnosis of obesity with Briana Brown today and Briana Brown agreed to give [name] permission to discuss obesity behavioral modification therapy today.  ASSESS: Briana Brown has the diagnosis of obesity and her BMI today is 33.1. Briana Brown is in the action stage of change.   ADVISE: Briana Brown was educated on the multiple health risks of obesity as well as the benefit of weight loss to improve her health. She was advised of the need for long term treatment and the importance of lifestyle  modifications to improve her current health and to decrease her risk of future health problems.  AGREE: Multiple dietary modification options and treatment options were discussed and  Briana Brown agreed to follow the recommendations documented in the above note.  ARRANGE: Briana Brown was educated on the importance of frequent visits to treat obesity as outlined per CMS and USPSTF guidelines and agreed to schedule her next follow up appointment today.  Migdalia Dk, am acting as transcriptionist for Abby Potash, PA-C I, Abby Potash, PA-C have reviewed above note and agree with its content

## 2018-09-01 DIAGNOSIS — M0579 Rheumatoid arthritis with rheumatoid factor of multiple sites without organ or systems involvement: Secondary | ICD-10-CM | POA: Diagnosis not present

## 2018-09-01 DIAGNOSIS — Z79899 Other long term (current) drug therapy: Secondary | ICD-10-CM | POA: Diagnosis not present

## 2018-09-05 ENCOUNTER — Encounter (INDEPENDENT_AMBULATORY_CARE_PROVIDER_SITE_OTHER): Payer: Self-pay

## 2018-09-12 DIAGNOSIS — M0579 Rheumatoid arthritis with rheumatoid factor of multiple sites without organ or systems involvement: Secondary | ICD-10-CM | POA: Diagnosis not present

## 2018-09-13 ENCOUNTER — Encounter (INDEPENDENT_AMBULATORY_CARE_PROVIDER_SITE_OTHER): Payer: Self-pay | Admitting: Physician Assistant

## 2018-09-13 ENCOUNTER — Other Ambulatory Visit: Payer: Self-pay

## 2018-09-13 ENCOUNTER — Ambulatory Visit (INDEPENDENT_AMBULATORY_CARE_PROVIDER_SITE_OTHER): Payer: BLUE CROSS/BLUE SHIELD | Admitting: Physician Assistant

## 2018-09-13 DIAGNOSIS — E669 Obesity, unspecified: Secondary | ICD-10-CM

## 2018-09-13 DIAGNOSIS — Z6834 Body mass index (BMI) 34.0-34.9, adult: Secondary | ICD-10-CM | POA: Diagnosis not present

## 2018-09-13 DIAGNOSIS — E8881 Metabolic syndrome: Secondary | ICD-10-CM | POA: Diagnosis not present

## 2018-09-14 NOTE — Progress Notes (Signed)
Office: [phone]  /  Fax: [phone] TeleHealth Visit:  Briana Brown has verbally consented to this TeleHealth visit today. The patient is located at home, the provider is located at the [facility] office. The participants in this visit include the listed provider and patient. The visit was conducted today via FaceTime.  HPI:   Chief Complaint: OBESITY Briana Brown is here to discuss her progress with her obesity treatment plan. She is on the Category 2 plan + 100 calories and is following her eating plan approximately 95% of the time. She states she is exercising 0 minutes 0 times per week. Briana Brown reports that she has been doing well on the plan. She has been able to be more creative with breakfast and lunch. She is concerned about the amount of meat for dinner during the summer. We were unable to weigh the patient today for this TeleHealth visit. She feels as if she has lost weight since her last visit. She has lost 8 lbs since starting treatment with [name].  Insulin Resistance Briana Brown has a diagnosis of insulin resistance based on her elevated fasting insulin level >5. Although Briana Brown's blood glucose readings are still under good control, insulin resistance puts her at greater risk of metabolic syndrome and diabetes. She is not taking metformin currently and continues to work on diet and exercise to decrease risk of diabetes. She denies polyphagia.  ASSESSMENT AND PLAN:  Insulin resistance  Class 1 obesity with serious comorbidity and body mass index (BMI) of 34.0 to 34.9 in adult, unspecified obesity type  PLAN:  Insulin Resistance Briana Brown will continue to work on weight loss, exercise, and decreasing simple carbohydrates in her diet to help decrease the risk of diabetes. We dicussed metformin including benefits and risks. She was informed that eating too many simple carbohydrates or too many calories at one sitting increases the likelihood of GI side effects. Briana Brown  is not on metformin for now and prescription was not written today. Briana Brown agreed to follow-up with [name] as directed to monitor her progress.  I spent > than 50% of the 25 minute visit on counseling as documented in the note.  Obesity Briana Brown is currently in the action stage of change. As such, her goal is to continue with weight loss efforts. She has agreed to follow the Category 2 plan + 100 calories. Briana Brown has been instructed to work up to a goal of 150 minutes of combined cardio and strengthening exercise per week for weight loss and overall health benefits. We discussed the following Behavioral Modification Strategies today: work on meal planning, easy cooking plans, and keeping healthy foods in the home.  Briana Brown has agreed to follow-up with our clinic in 2 weeks. She was informed of the importance of frequent follow-up visits to maximize her success with intensive lifestyle modifications for her multiple health conditions.  ALLERGIES: Allergies  Allergen Reactions  . Shellfish Allergy Nausea And Vomiting  . Soy Allergy   . Sulfa Antibiotics Nausea And Vomiting    MEDICATIONS: Current Outpatient Medications on File Prior to Visit  Medication Sig Dispense Refill  . b complex vitamins tablet Take 1 tablet by mouth daily.    . Biotin 5000 MCG TABS Take 1 tablet by mouth daily.    Marland Kitchen buPROPion (WELLBUTRIN XL) 150 MG 24 hr tablet Take 150 mg by mouth daily.    . celecoxib (CELEBREX) 200 MG capsule Take 200 mg by mouth 2 (two) times daily.    . cholecalciferol (VITAMIN D3) 25  MCG (1000 UT) tablet Take 1,000 Units by mouth daily.    . folic acid (FOLVITE) 1 MG tablet Take 1 mg by mouth daily.    . Golimumab 50 MG/0.5ML SOAJ Inject into the skin. 2 mg/kg inj every 8 weeks    . Hyaluronic Acid-Vitamin C (HYALURONIC ACID PO) Take 120 mg by mouth daily.    Marland Kitchen levothyroxine (SYNTHROID, LEVOTHROID) 150 MCG tablet Take 150 mcg by mouth daily before breakfast.    . loratadine (CLARITIN) 10 MG  tablet Take 10 mg by mouth daily.    . Magnesium 200 MG TABS Take 1 tablet by mouth daily.    . Melatonin 5 MG TABS Take 1 tablet by mouth daily.    . methotrexate 2.5 MG tablet Take by mouth once a week.    . Multiple Vitamins-Minerals (MULTIVITAMIN WITH MINERALS) tablet Take 1 tablet by mouth daily.     No current facility-administered medications on file prior to visit.     PAST MEDICAL HISTORY: Past Medical History:  Diagnosis Date  . Arthritis   . Depression   . Depression   . GERD (gastroesophageal reflux disease)   . Rheumatoid arthritis ([place])   . Thyroid cancer ([place])   . Thyroid disease     PAST SURGICAL HISTORY: Past Surgical History:  Procedure Laterality Date  . THYROID SURGERY    . TYMPANOPLASTY    . uterine ablation      SOCIAL HISTORY: Social History   Tobacco Use  . Smoking status: Never Smoker  . Smokeless tobacco: Never Used  Substance Use Topics  . Alcohol use: Not Currently  . Drug use: Never    FAMILY HISTORY: Family History  Problem Relation Age of Onset  . Cancer Mother   . Thyroid disease Mother   . Hyperlipidemia Father   . Depression Father    ROS: Review of Systems  Endo/Heme/Allergies:       Negative for polyphagia.   PHYSICAL EXAM: Pt in no acute distress  RECENT LABS AND TESTS: BMET    Component Value Date/Time   NA 140 07/13/2018 1401   K 3.9 07/13/2018 1401   CL 102 07/13/2018 1401   CO2 19 (L) 07/13/2018 1401   GLUCOSE 79 07/13/2018 1401   BUN 10 07/13/2018 1401   CREATININE 0.76 07/13/2018 1401   CALCIUM 8.5 (L) 07/13/2018 1401   GFRNONAA 101 07/13/2018 1401   GFRAA 116 07/13/2018 1401   Lab Results  Component Value Date   HGBA1C 5.2 07/13/2018   Lab Results  Component Value Date   INSULIN 15.4 07/13/2018   CBC    Component Value Date/Time   WBC 7.3 07/13/2018 1401   RBC 4.31 07/13/2018 1401   HGB 13.2 07/13/2018 1401   HCT 38.1 07/13/2018 1401   MCV 88 07/13/2018 1401   MCH 30.6 07/13/2018 1401    MCHC 34.6 07/13/2018 1401   RDW 11.9 07/13/2018 1401   LYMPHSABS 2.0 07/13/2018 1401   EOSABS 0.1 07/13/2018 1401   BASOSABS 0.0 07/13/2018 1401   Iron/TIBC/Ferritin/ %Sat No results found for: IRON, TIBC, FERRITIN, IRONPCTSAT Lipid Panel     Component Value Date/Time   CHOL 168 07/13/2018 1401   TRIG 56 07/13/2018 1401   HDL 40 07/13/2018 1401   LDLCALC 117 (H) 07/13/2018 1401   Hepatic Function Panel     Component Value Date/Time   PROT 7.0 07/13/2018 1401   ALBUMIN 4.3 07/13/2018 1401   AST 17 07/13/2018 1401   ALT 18 07/13/2018 1401  ALKPHOS 98 07/13/2018 1401   BILITOT 0.4 07/13/2018 1401      Component Value Date/Time   TSH 0.477 07/13/2018 1401   Results for Nogueira, Garrie "KATIE" (MRN [id number]) as of 09/14/2018 08:25  Ref. Range 07/13/2018 14:01  Vitamin D, 25-Hydroxy Latest Ref Range: 30.0 - 100.0 ng/mL 56.7    I, Michaelene Song, am acting as Location manager for [employer], PA-C I, Abby Potash, PA-C have reviewed above note and agree with its content

## 2018-09-27 ENCOUNTER — Encounter (INDEPENDENT_AMBULATORY_CARE_PROVIDER_SITE_OTHER): Payer: Self-pay | Admitting: Physician Assistant

## 2018-09-27 ENCOUNTER — Ambulatory Visit (INDEPENDENT_AMBULATORY_CARE_PROVIDER_SITE_OTHER): Payer: BLUE CROSS/BLUE SHIELD | Admitting: Physician Assistant

## 2018-09-27 ENCOUNTER — Other Ambulatory Visit: Payer: Self-pay

## 2018-09-27 DIAGNOSIS — Z6833 Body mass index (BMI) 33.0-33.9, adult: Secondary | ICD-10-CM

## 2018-09-27 DIAGNOSIS — E669 Obesity, unspecified: Secondary | ICD-10-CM

## 2018-09-27 DIAGNOSIS — E7849 Other hyperlipidemia: Secondary | ICD-10-CM | POA: Diagnosis not present

## 2018-09-28 NOTE — Progress Notes (Signed)
Office: [phone]  /  Fax: [phone] TeleHealth Visit:  Briana Brown has verbally consented to this TeleHealth visit today. The patient is located at home, the provider is located at the [employer] [facility] office. The participants in this visit include the listed provider and patient. The visit was conducted today via FaceTime.  HPI:   Chief Complaint: OBESITY [name] is here to discuss her progress with her obesity treatment plan. She is on the Category 2 plan + 100 calories and is following her eating plan approximately 90% of the time. She states she is exercising 0 minutes 0 times per week. [name] did well with weight loss. She reports breaking up her protein to ensure she gets it all in. We were unable to weigh the patient today for this TeleHealth visit. She feels as if she has lost 2 lbs since her last visit. She has lost 8 lbs since starting treatment with [name].  Hyperlipidemia [name] has hyperlipidemia and has been trying to improve her cholesterol levels with intensive lifestyle modification including a low saturated fat diet, exercise and weight loss. She is on no medication and denies any chest pain.  ASSESSMENT AND PLAN:  Other hyperlipidemia  Class 1 obesity with serious comorbidity and body mass index (BMI) of 33.0 to 33.9 in adult, unspecified obesity type  PLAN:  Hyperlipidemia [name] was informed of the American Heart Association Guidelines emphasizing intensive lifestyle modifications as the first line treatment for hyperlipidemia. We discussed many lifestyle modifications today in depth, and [name] will continue to work on decreasing saturated fats such as fatty red meat, butter and many fried foods. She will also increase vegetables and lean protein in her diet and continue to work on exercise and weight loss efforts.  Obesity [name] is currently in the action stage of change. As such, her goal is to continue with weight loss efforts. She has  agreed to follow the Category 2 plan + 100 calories. [name] has been instructed to work up to a goal of 150 minutes of combined cardio and strengthening exercise per week for weight loss and overall health benefits. We discussed the following Behavioral Modification Strategies today: work on meal planning and easy cooking plans.  [name] has agreed to follow-up with our clinic in 3 weeks. She was informed of the importance of frequent follow-up visits to maximize her success with intensive lifestyle modifications for her multiple health conditions.  ALLERGIES: Allergies  Allergen Reactions  . Shellfish Allergy Nausea And Vomiting  . Soy Allergy   . Sulfa Antibiotics Nausea And Vomiting    MEDICATIONS: Current Outpatient Medications on File Prior to Visit  Medication Sig Dispense Refill  . b complex vitamins tablet Take 1 tablet by mouth daily.    . Biotin 5000 MCG TABS Take 1 tablet by mouth daily.    Marland Kitchen buPROPion (WELLBUTRIN XL) 150 MG 24 hr tablet Take 150 mg by mouth daily.    . celecoxib (CELEBREX) 200 MG capsule Take 200 mg by mouth 2 (two) times daily.    . cholecalciferol (VITAMIN D3) 25 MCG (1000 UT) tablet Take 1,000 Units by mouth daily.    . folic acid (FOLVITE) 1 MG tablet Take 1 mg by mouth daily.    . Golimumab 50 MG/0.5ML SOAJ Inject into the skin. 2 mg/kg inj every 8 weeks    . Hyaluronic Acid-Vitamin C (HYALURONIC ACID PO) Take 120 mg by mouth daily.    Marland Kitchen levothyroxine (SYNTHROID, LEVOTHROID) 150 MCG tablet Take 150 mcg by  mouth daily before breakfast.    . loratadine (CLARITIN) 10 MG tablet Take 10 mg by mouth daily.    . Magnesium 200 MG TABS Take 1 tablet by mouth daily.    . Melatonin 5 MG TABS Take 1 tablet by mouth daily.    . methotrexate 2.5 MG tablet Take by mouth once a week.    . Multiple Vitamins-Minerals (MULTIVITAMIN WITH MINERALS) tablet Take 1 tablet by mouth daily.     No current facility-administered medications on file prior to visit.     PAST  MEDICAL HISTORY: Past Medical History:  Diagnosis Date  . Arthritis   . Depression   . Depression   . GERD (gastroesophageal reflux disease)   . Rheumatoid arthritis ([place])   . Thyroid cancer ([place])   . Thyroid disease     PAST SURGICAL HISTORY: Past Surgical History:  Procedure Laterality Date  . THYROID SURGERY    . TYMPANOPLASTY    . uterine ablation      SOCIAL HISTORY: Social History   Tobacco Use  . Smoking status: Never Smoker  . Smokeless tobacco: Never Used  Substance Use Topics  . Alcohol use: Not Currently  . Drug use: Never    FAMILY HISTORY: Family History  Problem Relation Age of Onset  . Cancer Mother   . Thyroid disease Mother   . Hyperlipidemia Father   . Depression Father    ROS: Review of Systems  Cardiovascular: Negative for chest pain.   PHYSICAL EXAM: Pt in no acute distress  RECENT LABS AND TESTS: BMET    Component Value Date/Time   NA 140 07/13/2018 1401   K 3.9 07/13/2018 1401   CL 102 07/13/2018 1401   CO2 19 (L) 07/13/2018 1401   GLUCOSE 79 07/13/2018 1401   BUN 10 07/13/2018 1401   CREATININE 0.76 07/13/2018 1401   CALCIUM 8.5 (L) 07/13/2018 1401   GFRNONAA 101 07/13/2018 1401   GFRAA 116 07/13/2018 1401   Lab Results  Component Value Date   HGBA1C 5.2 07/13/2018   Lab Results  Component Value Date   INSULIN 15.4 07/13/2018   CBC    Component Value Date/Time   WBC 7.3 07/13/2018 1401   RBC 4.31 07/13/2018 1401   HGB 13.2 07/13/2018 1401   HCT 38.1 07/13/2018 1401   MCV 88 07/13/2018 1401   MCH 30.6 07/13/2018 1401   MCHC 34.6 07/13/2018 1401   RDW 11.9 07/13/2018 1401   LYMPHSABS 2.0 07/13/2018 1401   EOSABS 0.1 07/13/2018 1401   BASOSABS 0.0 07/13/2018 1401   Iron/TIBC/Ferritin/ %Sat No results found for: IRON, TIBC, FERRITIN, IRONPCTSAT Lipid Panel     Component Value Date/Time   CHOL 168 07/13/2018 1401   TRIG 56 07/13/2018 1401   HDL 40 07/13/2018 1401   LDLCALC 117 (H) 07/13/2018 1401    Hepatic Function Panel     Component Value Date/Time   PROT 7.0 07/13/2018 1401   ALBUMIN 4.3 07/13/2018 1401   AST 17 07/13/2018 1401   ALT 18 07/13/2018 1401   ALKPHOS 98 07/13/2018 1401   BILITOT 0.4 07/13/2018 1401      Component Value Date/Time   TSH 0.477 07/13/2018 1401   Results for Arentz, Randall "KATIE" (MRN [id number]) as of 09/28/2018 07:17  Ref. Range 07/13/2018 14:01  Vitamin D, 25-Hydroxy Latest Ref Range: 30.0 - 100.0 ng/mL 56.7   I, Michaelene Song, am acting as Location manager for [employer], PA-C I, Abby Potash, PA-C have reviewed above note and agree  with its content

## 2018-10-14 ENCOUNTER — Encounter (INDEPENDENT_AMBULATORY_CARE_PROVIDER_SITE_OTHER): Payer: Self-pay | Admitting: Physician Assistant

## 2018-10-16 ENCOUNTER — Ambulatory Visit (INDEPENDENT_AMBULATORY_CARE_PROVIDER_SITE_OTHER): Payer: Self-pay | Admitting: Physician Assistant

## 2018-10-16 ENCOUNTER — Encounter (INDEPENDENT_AMBULATORY_CARE_PROVIDER_SITE_OTHER): Payer: Self-pay | Admitting: Physician Assistant

## 2018-10-18 ENCOUNTER — Encounter (INDEPENDENT_AMBULATORY_CARE_PROVIDER_SITE_OTHER): Payer: Self-pay | Admitting: Physician Assistant

## 2018-10-18 ENCOUNTER — Other Ambulatory Visit: Payer: Self-pay

## 2018-10-18 ENCOUNTER — Ambulatory Visit (INDEPENDENT_AMBULATORY_CARE_PROVIDER_SITE_OTHER): Payer: BLUE CROSS/BLUE SHIELD | Admitting: Physician Assistant

## 2018-10-18 DIAGNOSIS — Z6833 Body mass index (BMI) 33.0-33.9, adult: Secondary | ICD-10-CM

## 2018-10-18 DIAGNOSIS — E559 Vitamin D deficiency, unspecified: Secondary | ICD-10-CM

## 2018-10-18 DIAGNOSIS — E669 Obesity, unspecified: Secondary | ICD-10-CM | POA: Diagnosis not present

## 2018-10-19 NOTE — Progress Notes (Signed)
Office: [phone]  /  Fax: [phone] TeleHealth Visit:  Heidi Maclin has verbally consented to this TeleHealth visit today. The patient is located at home, the provider is located at the [facility] office. The participants in this visit include the listed provider and patient. The visit was conducted today via FaceTime.  HPI:   Chief Complaint: OBESITY Briana Brown is here to discuss her progress with her obesity treatment plan. She is on the Category 2 plan + 100 calories and is following her eating plan approximately 50% of the time. She states she is walking 10-15 minutes 2-3 times per week. [name] reports that her daughter had to go to the ER recently which caused her to eat off the plan for about 1 week. She was able to get back on track yesterday.  We were unable to weigh the patient today for this TeleHealth visit. She feels as if she has lost 1 lb since her last visit. She has lost 8 lbs since starting treatment with [name].  Vitamin D deficiency Briana Brown has a diagnosis of Vitamin D deficiency. She is currently taking OTC Vit D and denies nausea, vomiting or muscle weakness.  ASSESSMENT AND PLAN:  Vitamin D deficiency  Class 1 obesity with serious comorbidity and body mass index (BMI) of 33.0 to 33.9 in adult, unspecified obesity type  PLAN:  Vitamin D Deficiency Briana Brown was informed that low Vitamin D levels contributes to fatigue and are associated with obesity, breast, and colon cancer. She agrees to continue taking Vit D and will follow-up for routine testing of Vitamin D, at least 2-3 times per year. She was informed of the risk of over-replacement of Vitamin D and agrees to not increase her dose unless she discusses this with [name] first. Briana Brown agrees to follow-up with our clinic in 2 weeks.  Obesity Briana Brown is currently in the action stage of change. As such, her goal is to continue with weight loss efforts. She has agreed to follow the Category 2 plan +  100 calories. Briana Brown has been instructed to work up to a goal of 150 minutes of combined cardio and strengthening exercise per week for weight loss and overall health benefits. We discussed the following Behavioral Modification Strategies today: work on meal planning, easy cooking plans, and keeping healthy foods in the home.  Jaquesha has agreed to follow-up with our clinic in 2 weeks. She was informed of the importance of frequent follow-up visits to maximize her success with intensive lifestyle modifications for her multiple health conditions.  ALLERGIES: Allergies  Allergen Reactions  . Shellfish Allergy Nausea And Vomiting  . Soy Allergy   . Sulfa Antibiotics Nausea And Vomiting    MEDICATIONS: Current Outpatient Medications on File Prior to Visit  Medication Sig Dispense Refill  . b complex vitamins tablet Take 1 tablet by mouth daily.    . Biotin 5000 MCG TABS Take 1 tablet by mouth daily.    Marland Kitchen buPROPion (WELLBUTRIN XL) 150 MG 24 hr tablet Take 150 mg by mouth daily.    . celecoxib (CELEBREX) 200 MG capsule Take 200 mg by mouth 2 (two) times daily.    . cholecalciferol (VITAMIN D3) 25 MCG (1000 UT) tablet Take 1,000 Units by mouth daily.    . folic acid (FOLVITE) 1 MG tablet Take 1 mg by mouth daily.    . Golimumab 50 MG/0.5ML SOAJ Inject into the skin. 2 mg/kg inj every 8 weeks    . Hyaluronic Acid-Vitamin C (HYALURONIC ACID PO)  Take 120 mg by mouth daily.    Marland Kitchen levothyroxine (SYNTHROID, LEVOTHROID) 150 MCG tablet Take 150 mcg by mouth daily before breakfast.    . loratadine (CLARITIN) 10 MG tablet Take 10 mg by mouth daily.    . Magnesium 200 MG TABS Take 1 tablet by mouth daily.    . Melatonin 5 MG TABS Take 1 tablet by mouth daily.    . methotrexate 2.5 MG tablet Take by mouth once a week.    . Multiple Vitamins-Minerals (MULTIVITAMIN WITH MINERALS) tablet Take 1 tablet by mouth daily.     No current facility-administered medications on file prior to visit.     PAST  MEDICAL HISTORY: Past Medical History:  Diagnosis Date  . Arthritis   . Depression   . Depression   . GERD (gastroesophageal reflux disease)   . Rheumatoid arthritis ([place])   . Thyroid cancer ([place])   . Thyroid disease     PAST SURGICAL HISTORY: Past Surgical History:  Procedure Laterality Date  . THYROID SURGERY    . TYMPANOPLASTY    . uterine ablation      SOCIAL HISTORY: Social History   Tobacco Use  . Smoking status: Never Smoker  . Smokeless tobacco: Never Used  Substance Use Topics  . Alcohol use: Not Currently  . Drug use: Never    FAMILY HISTORY: Family History  Problem Relation Age of Onset  . Cancer Mother   . Thyroid disease Mother   . Hyperlipidemia Father   . Depression Father    ROS: Review of Systems  Gastrointestinal: Negative for nausea and vomiting.  Musculoskeletal:       Negative for muscle weakness.   PHYSICAL EXAM: Pt in no acute distress  RECENT LABS AND TESTS: BMET    Component Value Date/Time   NA 140 07/13/2018 1401   K 3.9 07/13/2018 1401   CL 102 07/13/2018 1401   CO2 19 (L) 07/13/2018 1401   GLUCOSE 79 07/13/2018 1401   BUN 10 07/13/2018 1401   CREATININE 0.76 07/13/2018 1401   CALCIUM 8.5 (L) 07/13/2018 1401   GFRNONAA 101 07/13/2018 1401   GFRAA 116 07/13/2018 1401   Lab Results  Component Value Date   HGBA1C 5.2 07/13/2018   Lab Results  Component Value Date   INSULIN 15.4 07/13/2018   CBC    Component Value Date/Time   WBC 7.3 07/13/2018 1401   RBC 4.31 07/13/2018 1401   HGB 13.2 07/13/2018 1401   HCT 38.1 07/13/2018 1401   MCV 88 07/13/2018 1401   MCH 30.6 07/13/2018 1401   MCHC 34.6 07/13/2018 1401   RDW 11.9 07/13/2018 1401   LYMPHSABS 2.0 07/13/2018 1401   EOSABS 0.1 07/13/2018 1401   BASOSABS 0.0 07/13/2018 1401   Iron/TIBC/Ferritin/ %Sat No results found for: IRON, TIBC, FERRITIN, IRONPCTSAT Lipid Panel     Component Value Date/Time   CHOL 168 07/13/2018 1401   TRIG 56 07/13/2018 1401    HDL 40 07/13/2018 1401   LDLCALC 117 (H) 07/13/2018 1401   Hepatic Function Panel     Component Value Date/Time   PROT 7.0 07/13/2018 1401   ALBUMIN 4.3 07/13/2018 1401   AST 17 07/13/2018 1401   ALT 18 07/13/2018 1401   ALKPHOS 98 07/13/2018 1401   BILITOT 0.4 07/13/2018 1401      Component Value Date/Time   TSH 0.477 07/13/2018 1401   Results for Arntson, [place] "KATIE" (MRN [id number]) as of 10/19/2018 08:11  Ref. Range 07/13/2018 14:01  Vitamin  D, 25-Hydroxy Latest Ref Range: 30.0 - 100.0 ng/mL 56.7    I, Michaelene Song, am acting as Location manager for [employer], PA-C I, Abby Potash, PA-C have reviewed above note and agree with its content

## 2018-11-01 DIAGNOSIS — M0579 Rheumatoid arthritis with rheumatoid factor of multiple sites without organ or systems involvement: Secondary | ICD-10-CM | POA: Diagnosis not present

## 2018-11-01 DIAGNOSIS — Z79899 Other long term (current) drug therapy: Secondary | ICD-10-CM | POA: Diagnosis not present

## 2018-11-02 ENCOUNTER — Encounter (INDEPENDENT_AMBULATORY_CARE_PROVIDER_SITE_OTHER): Payer: Self-pay

## 2018-11-02 ENCOUNTER — Ambulatory Visit (INDEPENDENT_AMBULATORY_CARE_PROVIDER_SITE_OTHER): Payer: BLUE CROSS/BLUE SHIELD | Admitting: Physician Assistant

## 2018-11-02 ENCOUNTER — Encounter (INDEPENDENT_AMBULATORY_CARE_PROVIDER_SITE_OTHER): Payer: Self-pay | Admitting: Physician Assistant

## 2018-11-02 ENCOUNTER — Other Ambulatory Visit: Payer: Self-pay

## 2018-12-27 DIAGNOSIS — M0579 Rheumatoid arthritis with rheumatoid factor of multiple sites without organ or systems involvement: Secondary | ICD-10-CM | POA: Diagnosis not present

## 2019-01-25 DIAGNOSIS — M0579 Rheumatoid arthritis with rheumatoid factor of multiple sites without organ or systems involvement: Secondary | ICD-10-CM | POA: Diagnosis not present

## 2019-01-25 DIAGNOSIS — M25552 Pain in left hip: Secondary | ICD-10-CM | POA: Diagnosis not present

## 2019-02-23 DIAGNOSIS — E890 Postprocedural hypothyroidism: Secondary | ICD-10-CM | POA: Diagnosis not present

## 2019-02-23 DIAGNOSIS — E663 Overweight: Secondary | ICD-10-CM | POA: Diagnosis not present

## 2019-02-23 DIAGNOSIS — M0579 Rheumatoid arthritis with rheumatoid factor of multiple sites without organ or systems involvement: Secondary | ICD-10-CM | POA: Diagnosis not present

## 2019-02-23 DIAGNOSIS — Z79899 Other long term (current) drug therapy: Secondary | ICD-10-CM | POA: Diagnosis not present

## 2019-04-20 DIAGNOSIS — M0579 Rheumatoid arthritis with rheumatoid factor of multiple sites without organ or systems involvement: Secondary | ICD-10-CM | POA: Diagnosis not present

## 2019-05-16 ENCOUNTER — Other Ambulatory Visit: Payer: Self-pay

## 2019-05-16 ENCOUNTER — Ambulatory Visit (INDEPENDENT_AMBULATORY_CARE_PROVIDER_SITE_OTHER): Payer: BC Managed Care – PPO | Admitting: Physician Assistant

## 2019-05-16 ENCOUNTER — Encounter (INDEPENDENT_AMBULATORY_CARE_PROVIDER_SITE_OTHER): Payer: Self-pay | Admitting: Physician Assistant

## 2019-05-16 VITALS — BP 131/82 | HR 89 | Temp 98.0°F | Ht 66.0 in | Wt 196.0 lb

## 2019-05-16 DIAGNOSIS — E669 Obesity, unspecified: Secondary | ICD-10-CM

## 2019-05-16 DIAGNOSIS — Z6831 Body mass index (BMI) 31.0-31.9, adult: Secondary | ICD-10-CM

## 2019-05-16 DIAGNOSIS — E038 Other specified hypothyroidism: Secondary | ICD-10-CM | POA: Diagnosis not present

## 2019-05-17 NOTE — Progress Notes (Signed)
Office: [phone]  /  Fax: [phone]   HPI:   Chief Complaint: OBESITY Briana Brown is here to discuss her progress with her obesity treatment plan. She is on the Category 2 plan +100 calories and she is following her eating plan approximately 60 % of the time. She states she is exercising 0 minutes 0 times per week. Briana Brown states that she did her best to somewhat follow the plan over the last few months, which have been very stressful. She is ready to restart. Her weight is 196 lb (88.9 kg) today and has had a weight loss of 9 pounds over a period of 7 months since her last visit. She has lost 17 lbs since starting treatment with [name].  Hypothyroidism Briana Brown has a diagnosis of hypothyroidism. She is on levothyroxine. She denies hot or cold intolerance or excessive fatigue.  ASSESSMENT AND PLAN:  Other specified hypothyroidism  Class 1 obesity with serious comorbidity and body mass index (BMI) of 31.0 to 31.9 in adult, unspecified obesity type  PLAN:  Hypothyroidism Briana Brown was informed of the importance of good thyroid control to help with weight loss efforts. She was also informed that supertherapeutic thyroid levels are dangerous and will not improve weight loss results. Briana Brown will continue with levothyroxine and follow up as directed.  I spent > than 50% of the 25 minute visit on counseling as documented in the note.  Obesity Briana Brown is currently in the action stage of change. As such, her goal is to continue with weight loss efforts She has agreed to keep a food journal with 400 to 500 calories and 35 grams of protein at supper daily and follow the Category 2 plan +100 calories Briana Brown has been instructed to work up to a goal of 150 minutes of combined cardio and strengthening exercise per week for weight loss and overall health benefits. We discussed the following Behavioral Modification Strategies today: decrease eating out and work on meal planning and easy cooking plans   Recipes were given to patient today.  Briana Brown has agreed to follow up with our clinic in 2 weeks. She was informed of the importance of frequent follow up visits to maximize her success with intensive lifestyle modifications for her multiple health conditions.  I spent > than 50% of the 25 minute visit on counseling as documented in the note.    ALLERGIES: Allergies  Allergen Reactions   Briana Brown Nausea And Vomiting   Briana Brown    Briana Brown Nausea And Vomiting    MEDICATIONS: Current Outpatient Medications on File Prior to Visit  Medication Sig Dispense Refill   b complex vitamins tablet Take 1 tablet by mouth daily.     Biotin 5000 MCG TABS Take 1 tablet by mouth daily.     buPROPion (WELLBUTRIN XL) 150 MG 24 hr tablet Take 150 mg by mouth daily.     celecoxib (CELEBREX) 200 MG capsule Take 200 mg by mouth 2 (two) times daily.     cholecalciferol (VITAMIN D3) 25 MCG (1000 UT) tablet Take 1,000 Units by mouth daily.     folic acid (FOLVITE) 1 MG tablet Take 1 mg by mouth daily.     Golimumab 50 MG/0.5ML SOAJ Inject into the skin. 2 mg/kg inj every 8 weeks     Hyaluronic Acid-Vitamin C (HYALURONIC ACID PO) Take 120 mg by mouth daily.     levothyroxine (SYNTHROID, LEVOTHROID) 150 MCG tablet Take 150 mcg by mouth daily before breakfast.     loratadine (CLARITIN)  10 MG tablet Take 10 mg by mouth daily.     Magnesium 200 MG TABS Take 1 tablet by mouth daily.     Melatonin 5 MG TABS Take 1 tablet by mouth daily.     methotrexate 2.5 MG tablet Take by mouth once a week.     Multiple Vitamins-Minerals (MULTIVITAMIN WITH MINERALS) tablet Take 1 tablet by mouth daily.     No current facility-administered medications on file prior to visit.     PAST MEDICAL HISTORY: Past Medical History:  Diagnosis Date   Arthritis    Depression    Depression    GERD (gastroesophageal reflux disease)    Rheumatoid arthritis ([place])    Thyroid cancer ([place])      Thyroid disease     PAST SURGICAL HISTORY: Past Surgical History:  Procedure Laterality Date   THYROID SURGERY     TYMPANOPLASTY     uterine ablation      SOCIAL HISTORY: Social History   Tobacco Use   Smoking status: Never Smoker   Smokeless tobacco: Never Used  Substance Use Topics   Alcohol use: Not Currently   Drug use: Never    FAMILY HISTORY: Family History  Problem Relation Age of Onset   Cancer Mother    Thyroid disease Mother    Hyperlipidemia Father    Depression Father     ROS: Review of Systems  Constitutional: Positive for weight loss. Negative for malaise/fatigue.  Endo/Heme/Allergies:       Negative for heat or cold intolerance    PHYSICAL EXAM: Blood pressure 131/82, pulse 89, temperature 98 F (36.7 C), temperature source Oral, height 5\' 6"  (1.676 m), weight 196 lb (88.9 kg), SpO2 97 %. Body mass index is 31.64 kg/m. Physical Exam Vitals signs reviewed.  Constitutional:      Appearance: Normal appearance. She is well-developed. She is obese.  Cardiovascular:     Rate and Rhythm: Normal rate.  Pulmonary:     Effort: Pulmonary effort is normal.  Musculoskeletal: Normal range of motion.  Skin:    General: Skin is warm and dry.  Neurological:     Mental Status: She is alert and oriented to person, place, and time.  Psychiatric:        Mood and Affect: Mood normal.        Behavior: Behavior normal.     RECENT LABS AND TESTS: BMET    Component Value Date/Time   NA 140 07/13/2018 1401   K 3.9 07/13/2018 1401   CL 102 07/13/2018 1401   CO2 19 (L) 07/13/2018 1401   GLUCOSE 79 07/13/2018 1401   BUN 10 07/13/2018 1401   CREATININE 0.76 07/13/2018 1401   CALCIUM 8.5 (L) 07/13/2018 1401   GFRNONAA 101 07/13/2018 1401   GFRAA 116 07/13/2018 1401   Lab Results  Component Value Date   HGBA1C 5.2 07/13/2018   Lab Results  Component Value Date   INSULIN 15.4 07/13/2018   CBC    Component Value Date/Time   WBC 7.3  07/13/2018 1401   RBC 4.31 07/13/2018 1401   HGB 13.2 07/13/2018 1401   HCT 38.1 07/13/2018 1401   MCV 88 07/13/2018 1401   MCH 30.6 07/13/2018 1401   MCHC 34.6 07/13/2018 1401   RDW 11.9 07/13/2018 1401   LYMPHSABS 2.0 07/13/2018 1401   EOSABS 0.1 07/13/2018 1401   BASOSABS 0.0 07/13/2018 1401   Iron/TIBC/Ferritin/ %Sat No results found for: IRON, TIBC, FERRITIN, IRONPCTSAT Lipid Panel     Component  Value Date/Time   CHOL 168 07/13/2018 1401   TRIG 56 07/13/2018 1401   HDL 40 07/13/2018 1401   LDLCALC 117 (H) 07/13/2018 1401   Hepatic Function Panel     Component Value Date/Time   PROT 7.0 07/13/2018 1401   ALBUMIN 4.3 07/13/2018 1401   AST 17 07/13/2018 1401   ALT 18 07/13/2018 1401   ALKPHOS 98 07/13/2018 1401   BILITOT 0.4 07/13/2018 1401      Component Value Date/Time   TSH 0.477 07/13/2018 1401     Ref. Range 07/13/2018 14:01  Vitamin D, 25-Hydroxy Latest Ref Range: 30.0 - 100.0 ng/mL 56.7    OBESITY BEHAVIORAL INTERVENTION VISIT  Today's visit was # 8   Starting weight: 213 lbs Starting date: 07/13/2018 Today's weight : 196 lbs Today's date: 05/16/2019 Total lbs lost to date: 17    05/16/2019  Height 5\' 6"  (1.676 m)  Weight 196 lb (88.9 kg)  BMI (Calculated) 31.65  BLOOD PRESSURE - SYSTOLIC [id number]  BLOOD PRESSURE - DIASTOLIC 82   Body Fat % 0000000 %  Total Body Water (lbs) 75.8 lbs    ASK: We discussed the diagnosis of obesity with Briana Brown today and Briana Brown agreed to give [name] permission to discuss obesity behavioral modification therapy today.  ASSESS: Briana Brown has the diagnosis of obesity and her BMI today is 31.65 Briana Brown is in the action stage of change   ADVISE: Briana Brown was educated on the multiple health risks of obesity as well as the benefit of weight loss to improve her health. She was advised of the need for long term treatment and the importance of lifestyle modifications to improve her current health and to decrease her risk of future  health problems.  AGREE: Multiple dietary modification options and treatment options were discussed and  Briana Brown agreed to follow the recommendations documented in the above note.  ARRANGE: Briana Brown was educated on the importance of frequent visits to treat obesity as outlined per CMS and USPSTF guidelines and agreed to schedule her next follow up appointment today.  Corey Skains, am acting as transcriptionist for Abby Potash, PA-C I, Abby Potash, PA-C have reviewed above note and agree with its content

## 2019-05-31 ENCOUNTER — Ambulatory Visit (INDEPENDENT_AMBULATORY_CARE_PROVIDER_SITE_OTHER): Payer: BC Managed Care – PPO | Admitting: Physician Assistant

## 2019-06-18 DIAGNOSIS — M0579 Rheumatoid arthritis with rheumatoid factor of multiple sites without organ or systems involvement: Secondary | ICD-10-CM | POA: Diagnosis not present

## 2019-06-25 ENCOUNTER — Encounter (INDEPENDENT_AMBULATORY_CARE_PROVIDER_SITE_OTHER): Payer: Self-pay | Admitting: Physician Assistant

## 2019-06-25 ENCOUNTER — Other Ambulatory Visit: Payer: Self-pay

## 2019-06-25 ENCOUNTER — Ambulatory Visit (INDEPENDENT_AMBULATORY_CARE_PROVIDER_SITE_OTHER): Payer: BC Managed Care – PPO | Admitting: Physician Assistant

## 2019-06-25 VITALS — BP 121/75 | HR 89 | Temp 98.2°F | Ht 66.0 in | Wt 201.0 lb

## 2019-06-25 DIAGNOSIS — E8881 Metabolic syndrome: Secondary | ICD-10-CM | POA: Diagnosis not present

## 2019-06-25 DIAGNOSIS — Z9189 Other specified personal risk factors, not elsewhere classified: Secondary | ICD-10-CM

## 2019-06-25 DIAGNOSIS — E669 Obesity, unspecified: Secondary | ICD-10-CM

## 2019-06-25 DIAGNOSIS — Z6832 Body mass index (BMI) 32.0-32.9, adult: Secondary | ICD-10-CM

## 2019-06-25 DIAGNOSIS — E559 Vitamin D deficiency, unspecified: Secondary | ICD-10-CM | POA: Diagnosis not present

## 2019-06-25 DIAGNOSIS — E7849 Other hyperlipidemia: Secondary | ICD-10-CM | POA: Diagnosis not present

## 2019-06-26 LAB — COMPREHENSIVE METABOLIC PANEL
ALT: 12 IU/L (ref 0–32)
AST: 13 IU/L (ref 0–40)
Albumin/Globulin Ratio: 1.7 (ref 1.2–2.2)
Albumin: 4.2 g/dL (ref 3.8–4.8)
Alkaline Phosphatase: 100 IU/L (ref 39–117)
BUN/Creatinine Ratio: 13 (ref 9–23)
BUN: 10 mg/dL (ref 6–20)
Bilirubin Total: 0.3 mg/dL (ref 0.0–1.2)
CO2: 25 mmol/L (ref 20–29)
Calcium: 8.7 mg/dL (ref 8.7–10.2)
Chloride: 105 mmol/L (ref 96–106)
Creatinine, Ser: 0.77 mg/dL (ref 0.57–1.00)
GFR calc Af Amer: 113 mL/min/{1.73_m2} (ref >59)
GFR calc non Af Amer: 98 mL/min/{1.73_m2} (ref >59)
Globulin, Total: 2.5 g/dL (ref 1.5–4.5)
Glucose: 86 mg/dL (ref 65–99)
Potassium: 4.4 mmol/L (ref 3.5–5.2)
Sodium: 141 mmol/L (ref 134–144)
Total Protein: 6.7 g/dL (ref 6.0–8.5)

## 2019-06-26 LAB — HEMOGLOBIN A1C
Est. average glucose Bld gHb Est-mCnc: 103 mg/dL
Hgb A1c MFr Bld: 5.2 % (ref 4.8–5.6)

## 2019-06-26 LAB — LIPID PANEL WITH LDL/HDL RATIO
Cholesterol, Total: 190 mg/dL (ref 100–199)
HDL: 46 mg/dL (ref >39)
LDL Chol Calc (NIH): 133 mg/dL — ABNORMAL HIGH (ref 0–99)
LDL/HDL Ratio: 2.9 ratio (ref 0.0–3.2)
Triglycerides: 57 mg/dL (ref 0–149)
VLDL Cholesterol Cal: 11 mg/dL (ref 5–40)

## 2019-06-26 LAB — VITAMIN D 25 HYDROXY (VIT D DEFICIENCY, FRACTURES): Vit D, 25-Hydroxy: 37.5 ng/mL (ref 30.0–100.0)

## 2019-06-26 LAB — INSULIN, RANDOM: INSULIN: 11.3 u[IU]/mL (ref 2.6–24.9)

## 2019-06-27 NOTE — Progress Notes (Signed)
Chief Complaint:   OBESITY Briana Brown is here to discuss her progress with her obesity treatment plan along with follow-up of her obesity related diagnoses. Briana Brown is on the Category 2 Plan +100 calories and states she is following her eating plan approximately 50% of the time. Briana Brown states she is exercising 0 minutes 0 times per week.  Today's visit was #: 9 Starting weight: 213 lbs Starting date: 07/13/2018 Today's weight: 201 lbs Today's date: 06/25/2019 Total lbs lost to date: 12 Total lbs lost since last in-office visit: 0  Interim History: Briana Brown reports that she feels "disappointed" with her food choices over the holidays and last week while traveling. She did not meal plan. Briana Brown has not been exercising.  Subjective:   Other hyperlipidemia Briana Brown is not on medications. Her last LDL was 117 (07/13/18). Briana Brown has no chest pain.  Vitamin D deficiency  Briana Brown is on OTC vitamin D 1,000 units daily. Her last vitamin D level was 56.7 (07/13/18). She has no nausea, vomiting or muscle weakness.  Insulin resistance  Briana Brown's last insulin level was 15.4 (07/13/18). She is not on medications. Briana Brown has no cravings.  At risk for heart disease Briana Brown is at a higher than average risk for cardiovascular disease due to obesity. Reviewed: no chest pain on exertion, no dyspnea on exertion, and no swelling of ankles.  Assessment/Plan:   Other hyperlipidemia  Cardiovascular risk and specific lipid/LDL goals reviewed.  We discussed several lifestyle modifications today and Zana will continue with the plan and start exercise. She will continue to work on diet and weight loss efforts. Orders and follow up as documented in patient record.   Counseling Intensive lifestyle modifications are the first line treatment for this issue. . Dietary changes: Increase soluble fiber. Decrease simple carbohydrates. . Exercise changes: Moderate to vigorous-intensity aerobic activity 150 minutes per week  if tolerated. . Lipid-lowering medications: see documented in medical record.   Vitamin D deficiency  Low Vitamin D level contributes to fatigue and are associated with obesity, breast, and colon cancer. Briana Brown will continue to take OTC Vitamin D @1 ,000 IU daily and she will follow-up for routine testing of vitamin D, at least 2-3 times per year to avoid over-replacement.  Insulin resistance Briana Brown will continue to work on weight loss, exercise, and decreasing simple carbohydrates to help decrease the risk of diabetes. Briana Brown agreed to follow-up with [name] as directed to closely monitor her progress.  At risk for heart disease Briana Brown was given approximately 15 minutes of coronary artery disease prevention counseling today. She is 39 y.o. female and has risk factors for heart disease including obesity. We discussed intensive lifestyle modifications today with an emphasis on specific weight loss instructions and strategies.   Obesity Briana Brown is currently in the action stage of change. As such, her goal is to continue with weight loss efforts. She has agreed to keeping a food journal and adhering to recommended goals of 1200 to 1300 calories and 85 grams of protein daily.   We discussed the following exercise goals today: For substantial health benefits, adults should do at least 150 minutes (2 hours and 30 minutes) a week of moderate-intensity, or 75 minutes (1 hour and 15 minutes) a week of vigorous-intensity aerobic physical activity, or an equivalent combination of moderate- and vigorous-intensity aerobic activity. Aerobic activity should be performed in episodes of at least 10 minutes, and preferably, it should be spread throughout the week. Adults should also include muscle-strengthening activities that involve all major  muscle groups on 2 or more days a week.  We discussed the following behavioral modification strategies today: meal planning and cooking strategies and planning for  success.  Briana Brown has agreed to follow-up with our clinic in 3 weeks. She was informed of the importance of frequent follow-up visits to maximize her success with intensive lifestyle modifications for her multiple health conditions.   Briana Brown was informed we would discuss her lab results at her next visit unless there is a critical issue that needs to be addressed sooner. Briana Brown agreed to keep her next visit at the agreed upon time to discuss these results.  Objective:   Blood pressure 121/75, pulse 89, temperature 98.2 F (36.8 C), temperature source Oral, height 5\' 6"  (1.676 m), weight 201 lb (91.2 kg), SpO2 98 %. Body mass index is 32.44 kg/m.  General: Cooperative, alert, well developed, in no acute distress. HEENT: Conjunctivae and lids unremarkable. Neck: No thyromegaly.  Cardiovascular: Regular rhythm.  Lungs: Normal work of breathing. Extremities: No edema.  Neurologic: No focal deficits.   Lab Results  Component Value Date   CREATININE 0.77 06/25/2019   BUN 10 06/25/2019   NA 141 06/25/2019   K 4.4 06/25/2019   CL 105 06/25/2019   CO2 25 06/25/2019   Lab Results  Component Value Date   ALT 12 06/25/2019   AST 13 06/25/2019   ALKPHOS 100 06/25/2019   BILITOT 0.3 06/25/2019   Lab Results  Component Value Date   HGBA1C 5.2 06/25/2019   HGBA1C 5.2 07/13/2018   Lab Results  Component Value Date   INSULIN 11.3 06/25/2019   INSULIN 15.4 07/13/2018   Lab Results  Component Value Date   TSH 0.477 07/13/2018   Lab Results  Component Value Date   CHOL 190 06/25/2019   HDL 46 06/25/2019   LDLCALC 133 (H) 06/25/2019   TRIG 57 06/25/2019   Lab Results  Component Value Date   WBC 7.3 07/13/2018   HGB 13.2 07/13/2018   HCT 38.1 07/13/2018   MCV 88 07/13/2018   No results found for: IRON, TIBC, FERRITIN   Ref. Range 07/13/2018 14:01  Vitamin D, 25-Hydroxy Latest Ref Range: 30.0 - 100.0 ng/mL 56.7    Attestation Statements:   Reviewed by clinician on  day of visit: allergies, medications, problem list, medical history, surgical history, family history, social history, and previous encounter notes.  Corey Skains, am acting as Location manager for [employer], PA-C.  I have reviewed the above documentation for accuracy and completeness, and I agree with the above. Abby Potash, PA-C

## 2019-07-04 DIAGNOSIS — F4321 Adjustment disorder with depressed mood: Secondary | ICD-10-CM | POA: Diagnosis not present

## 2019-07-16 ENCOUNTER — Other Ambulatory Visit: Payer: Self-pay

## 2019-07-16 ENCOUNTER — Ambulatory Visit (INDEPENDENT_AMBULATORY_CARE_PROVIDER_SITE_OTHER): Payer: BC Managed Care – PPO | Admitting: Physician Assistant

## 2019-07-16 ENCOUNTER — Encounter (INDEPENDENT_AMBULATORY_CARE_PROVIDER_SITE_OTHER): Payer: Self-pay | Admitting: Physician Assistant

## 2019-07-16 VITALS — BP 119/80 | HR 88 | Temp 98.1°F | Ht 66.0 in | Wt 203.0 lb

## 2019-07-16 DIAGNOSIS — E559 Vitamin D deficiency, unspecified: Secondary | ICD-10-CM | POA: Diagnosis not present

## 2019-07-16 DIAGNOSIS — Z6832 Body mass index (BMI) 32.0-32.9, adult: Secondary | ICD-10-CM | POA: Diagnosis not present

## 2019-07-16 DIAGNOSIS — E669 Obesity, unspecified: Secondary | ICD-10-CM | POA: Diagnosis not present

## 2019-07-16 NOTE — Progress Notes (Signed)
Chief Complaint:   OBESITY Briana Brown is here to discuss her progress with her obesity treatment plan along with follow-up of her obesity related diagnoses. Briana Brown is on the Category 2 Plan + 100 calories and states she is following her eating plan approximately 90% of the time. Briana Brown states she is exercising on the elliptical/weights 15 minutes 2 times per week.  Today's visit was #: 10 Starting weight: 213 lbs Starting date: 07/13/2018 Today's weight: 203 lbs Today's date: 07/16/2019 Total lbs lost to date: 10 Total lbs lost since last in-office visit: 0  Interim History: Briana Brown had a migraine recently and only ate mashed potatoes, as she reports that she is only able to eat mashed potatoes, if she does eat, while having a migraine. She notes more hunger throughout the day, but is not reaching her protein goal.  Subjective:   Vitamin D deficiency. Last Vitamin D level was 37.5 on 06/25/2019, worsened from 56.7. She is currently on 1,000 units daily.  Assessment/Plan:   Vitamin D deficiency. Low Vitamin D level contributes to fatigue and are associated with obesity, breast, and colon cancer. She will increase her Vitamin D to 3,000 units daily (OTC), no prescription needed, and will follow-up for routine testing of Vitamin D, at least 2-3 times per year to avoid over-replacement.  Class 1 obesity with serious comorbidity and body mass index (BMI) of 32.0 to 32.9 in adult, unspecified obesity type.  Briana Brown is currently in the action stage of change. As such, her goal is to continue with weight loss efforts. She has agreed to the Category 2 Plan + 100 calories and journal 1300 calories + 85 grams of protein daily.   Exercise goals: For substantial health benefits, adults should do at least 150 minutes (2 hours and 30 minutes) a week of moderate-intensity, or 75 minutes (1 hour and 15 minutes) a week of vigorous-intensity aerobic physical activity, or an equivalent combination of  moderate- and vigorous-intensity aerobic activity. Aerobic activity should be performed in episodes of at least 10 minutes, and preferably, it should be spread throughout the week.  Behavioral modification strategies: increasing lean protein intake and meal planning and cooking strategies.  Briana Brown has agreed to follow-up with our clinic in 2 weeks. She was informed of the importance of frequent follow-up visits to maximize her success with intensive lifestyle modifications for her multiple health conditions.   Objective:   Blood pressure 119/80, pulse 88, temperature 98.1 F (36.7 C), temperature source Oral, height 5\' 6"  (1.676 m), weight 203 lb (92.1 kg), SpO2 96 %. Body mass index is 32.77 kg/m.  General: Cooperative, alert, well developed, in no acute distress. HEENT: Conjunctivae and lids unremarkable. Cardiovascular: Regular rhythm.  Lungs: Normal work of breathing. Neurologic: No focal deficits.   Lab Results  Component Value Date   CREATININE 0.77 06/25/2019   BUN 10 06/25/2019   NA 141 06/25/2019   K 4.4 06/25/2019   CL 105 06/25/2019   CO2 25 06/25/2019   Lab Results  Component Value Date   ALT 12 06/25/2019   AST 13 06/25/2019   ALKPHOS 100 06/25/2019   BILITOT 0.3 06/25/2019   Lab Results  Component Value Date   HGBA1C 5.2 06/25/2019   HGBA1C 5.2 07/13/2018   Lab Results  Component Value Date   INSULIN 11.3 06/25/2019   INSULIN 15.4 07/13/2018   Lab Results  Component Value Date   TSH 0.477 07/13/2018   Lab Results  Component Value Date  CHOL 190 06/25/2019   HDL 46 06/25/2019   LDLCALC 133 (H) 06/25/2019   TRIG 57 06/25/2019   Lab Results  Component Value Date   WBC 7.3 07/13/2018   HGB 13.2 07/13/2018   HCT 38.1 07/13/2018   MCV 88 07/13/2018   No results found for: IRON, TIBC, FERRITIN  Attestation Statements:   Reviewed by clinician on day of visit: allergies, medications, problem list, medical history, surgical history, family  history, social history, and previous encounter notes.  Time spent on visit including pre-visit chart review and post-visit care was 32 minutes.   IMichaelene Song, am acting as transcriptionist for Abby Potash, PA-C   I have reviewed the above documentation for accuracy and completeness, and I agree with the above. Abby Potash, PA-C

## 2019-07-31 DIAGNOSIS — M0579 Rheumatoid arthritis with rheumatoid factor of multiple sites without organ or systems involvement: Secondary | ICD-10-CM | POA: Diagnosis not present

## 2019-08-02 ENCOUNTER — Ambulatory Visit (INDEPENDENT_AMBULATORY_CARE_PROVIDER_SITE_OTHER): Payer: BC Managed Care – PPO | Admitting: Physician Assistant

## 2019-08-06 ENCOUNTER — Ambulatory Visit (INDEPENDENT_AMBULATORY_CARE_PROVIDER_SITE_OTHER): Payer: BC Managed Care – PPO | Admitting: Physician Assistant

## 2019-08-06 ENCOUNTER — Encounter (INDEPENDENT_AMBULATORY_CARE_PROVIDER_SITE_OTHER): Payer: Self-pay | Admitting: Physician Assistant

## 2019-08-06 ENCOUNTER — Other Ambulatory Visit: Payer: Self-pay

## 2019-08-06 VITALS — BP 130/87 | HR 98 | Temp 98.5°F | Ht 66.0 in | Wt 200.0 lb

## 2019-08-06 DIAGNOSIS — E7849 Other hyperlipidemia: Secondary | ICD-10-CM

## 2019-08-06 DIAGNOSIS — Z6832 Body mass index (BMI) 32.0-32.9, adult: Secondary | ICD-10-CM

## 2019-08-06 DIAGNOSIS — E669 Obesity, unspecified: Secondary | ICD-10-CM

## 2019-08-07 NOTE — Progress Notes (Signed)
Chief Complaint:   OBESITY Briana Brown is here to discuss her progress with her obesity treatment plan along with follow-up of her obesity related diagnoses. Briana Brown is on the Category 2 Plan and journaling 1200 calories and 85 grams of protein. She states she is following her eating plan approximately 100% of the time. Briana Brown states she is walking 20 minutes 5 times per week.  Today's visit was #: 11 Starting weight: 213 lbs Starting date: 07/13/2018 Today's weight: 200 lbs Today's date: 08/06/2019 Total lbs lost to date: 13 Total lbs lost since last in-office visit: 3  Interim History: Briana Brown reports she has been following the plan very closely recently. She just bought an exercise bike.  Subjective:   Other hyperlipidemia. Briana Brown has hyperlipidemia and has been trying to improve her cholesterol levels with intensive lifestyle modification including a low saturated fat diet, exercise and weight loss. She denies any chest pain. Briana Brown is on no medications. Last level was not at goal. She is now exercising.  Lab Results  Component Value Date   ALT 12 06/25/2019   AST 13 06/25/2019   ALKPHOS 100 06/25/2019   BILITOT 0.3 06/25/2019   Lab Results  Component Value Date   CHOL 190 06/25/2019   HDL 46 06/25/2019   LDLCALC 133 (H) 06/25/2019   TRIG 57 06/25/2019   Assessment/Plan:   Other hyperlipidemia. Cardiovascular risk and specific lipid/LDL goals reviewed.  We discussed several lifestyle modifications today and Briana Brown will continue to work on diet, exercise and weight loss efforts. Orders and follow up as documented in patient record. Briana Brown will continue with weight loss and continue with exercising.  Counseling Intensive lifestyle modifications are the first line treatment for this issue.  Dietary changes: Increase soluble fiber. Decrease simple carbohydrates.  Exercise changes: Moderate to vigorous-intensity aerobic activity 150 minutes per week if  tolerated.  Lipid-lowering medications: see documented in medical record.  Class 1 obesity with serious comorbidity and body mass index (BMI) of 32.0 to 32.9 in adult, unspecified obesity type.  Briana Brown is currently in the action stage of change. As such, her goal is to continue with weight loss efforts. She has agreed to the Category 2 Plan.   Exercise goals: For substantial health benefits, adults should do at least 150 minutes (2 hours and 30 minutes) a week of moderate-intensity, or 75 minutes (1 hour and 15 minutes) a week of vigorous-intensity aerobic physical activity, or an equivalent combination of moderate- and vigorous-intensity aerobic activity. Aerobic activity should be performed in episodes of at least 10 minutes, and preferably, it should be spread throughout the week.  Behavioral modification strategies: meal planning and cooking strategies and keeping healthy foods in the home.  Briana Brown has agreed to follow-up with our clinic in 2 weeks. She was informed of the importance of frequent follow-up visits to maximize her success with intensive lifestyle modifications for her multiple health conditions.   Objective:   Blood pressure 130/87, pulse 98, temperature 98.5 F (36.9 C), temperature source Oral, height 5\' 6"  (1.676 m), weight 200 lb (90.7 kg), SpO2 97 %. Body mass index is 32.28 kg/m.  General: Cooperative, alert, well developed, in no acute distress. HEENT: Conjunctivae and lids unremarkable. Cardiovascular: Regular rhythm.  Lungs: Normal work of breathing. Neurologic: No focal deficits.   Lab Results  Component Value Date   CREATININE 0.77 06/25/2019   BUN 10 06/25/2019   NA 141 06/25/2019   K 4.4 06/25/2019   CL 105 06/25/2019  CO2 25 06/25/2019   Lab Results  Component Value Date   ALT 12 06/25/2019   AST 13 06/25/2019   ALKPHOS 100 06/25/2019   BILITOT 0.3 06/25/2019   Lab Results  Component Value Date   HGBA1C 5.2 06/25/2019   HGBA1C 5.2  07/13/2018   Lab Results  Component Value Date   INSULIN 11.3 06/25/2019   INSULIN 15.4 07/13/2018   Lab Results  Component Value Date   TSH 0.477 07/13/2018   Lab Results  Component Value Date   CHOL 190 06/25/2019   HDL 46 06/25/2019   LDLCALC 133 (H) 06/25/2019   TRIG 57 06/25/2019   Lab Results  Component Value Date   WBC 7.3 07/13/2018   HGB 13.2 07/13/2018   HCT 38.1 07/13/2018   MCV 88 07/13/2018   No results found for: IRON, TIBC, FERRITIN  Attestation Statements:   Reviewed by clinician on day of visit: allergies, medications, problem list, medical history, surgical history, family history, social history, and previous encounter notes.  Time spent on visit including pre-visit chart review and post-visit care was 31 minutes.   IMichaelene Song, am acting as transcriptionist for Abby Potash, PA-C   I have reviewed the above documentation for accuracy and completeness, and I agree with the above. Abby Potash, PA-C

## 2019-08-13 DIAGNOSIS — Z79899 Other long term (current) drug therapy: Secondary | ICD-10-CM | POA: Diagnosis not present

## 2019-08-13 DIAGNOSIS — M0579 Rheumatoid arthritis with rheumatoid factor of multiple sites without organ or systems involvement: Secondary | ICD-10-CM | POA: Diagnosis not present

## 2019-08-22 ENCOUNTER — Other Ambulatory Visit: Payer: Self-pay

## 2019-08-22 ENCOUNTER — Ambulatory Visit (INDEPENDENT_AMBULATORY_CARE_PROVIDER_SITE_OTHER): Payer: BC Managed Care – PPO | Admitting: Physician Assistant

## 2019-08-22 ENCOUNTER — Encounter (INDEPENDENT_AMBULATORY_CARE_PROVIDER_SITE_OTHER): Payer: Self-pay | Admitting: Physician Assistant

## 2019-08-22 VITALS — BP 132/79 | HR 108 | Temp 98.4°F | Ht 66.0 in | Wt 201.0 lb

## 2019-08-22 DIAGNOSIS — E559 Vitamin D deficiency, unspecified: Secondary | ICD-10-CM

## 2019-08-22 DIAGNOSIS — E669 Obesity, unspecified: Secondary | ICD-10-CM | POA: Diagnosis not present

## 2019-08-22 DIAGNOSIS — Z6832 Body mass index (BMI) 32.0-32.9, adult: Secondary | ICD-10-CM | POA: Diagnosis not present

## 2019-08-22 NOTE — Progress Notes (Signed)
Chief Complaint:   OBESITY Briana Brown is here to discuss her progress with her obesity treatment plan along with follow-up of her obesity related diagnoses. Briana Brown is on the Category 2 Plan and states she is following her eating plan approximately 100% of the time. Briana Brown states she is biking 30 minutes 3-4 times per week.  Today's visit was #: 12 Starting weight: 213 lbs Starting date: 07/13/2018 Today's weight: 201 lbs Today's date: 08/22/2019 Total lbs lost to date: 12 Total lbs lost since last in-office visit: 0  Interim History: Briana Brown reports that she is getting between 1200-1400 calories daily and averages about 90 grams of protein.  Subjective:   Vitamin D deficiency. Chastity is on Vitamin D 3,000 units daily. No nausea, vomiting, or muscle weakness. Last Vitamin D 37.5 on 06/25/2019.  Assessment/Plan:   Vitamin D deficiency. Low Vitamin D level contributes to fatigue and are associated with obesity, breast, and colon cancer. She agrees to continue to take Vitamin D and will follow-up for routine testing of Vitamin D, at least 2-3 times per year to avoid over-replacement.  Class 1 obesity with serious comorbidity and body mass index (BMI) of 32.0 to 32.9 in adult, unspecified obesity type.  Briana Brown is currently in the action stage of change. As such, her goal is to continue with weight loss efforts. She has agreed to keeping a food journal and adhering to recommended goals of 1200-1250 calories and 85 grams of protein daily.   Exercise goals: For substantial health benefits, adults should do at least 150 minutes (2 hours and 30 minutes) a week of moderate-intensity, or 75 minutes (1 hour and 15 minutes) a week of vigorous-intensity aerobic physical activity, or an equivalent combination of moderate- and vigorous-intensity aerobic activity. Aerobic activity should be performed in episodes of at least 10 minutes, and preferably, it should be spread throughout the  week.  Behavioral modification strategies: meal planning and cooking strategies and keeping healthy foods in the home.  Briana Brown has agreed to follow-up with our clinic in 2 weeks. She was informed of the importance of frequent follow-up visits to maximize her success with intensive lifestyle modifications for her multiple health conditions.   Objective:   Blood pressure 132/79, pulse (!) 108, temperature 98.4 F (36.9 C), temperature source Oral, height 5\' 6"  (1.676 m), weight 201 lb (91.2 kg), SpO2 97 %. Body mass index is 32.44 kg/m.  General: Cooperative, alert, well developed, in no acute distress. HEENT: Conjunctivae and lids unremarkable. Cardiovascular: Regular rhythm.  Lungs: Normal work of breathing. Neurologic: No focal deficits.   Lab Results  Component Value Date   CREATININE 0.77 06/25/2019   BUN 10 06/25/2019   NA 141 06/25/2019   K 4.4 06/25/2019   CL 105 06/25/2019   CO2 25 06/25/2019   Lab Results  Component Value Date   ALT 12 06/25/2019   AST 13 06/25/2019   ALKPHOS 100 06/25/2019   BILITOT 0.3 06/25/2019   Lab Results  Component Value Date   HGBA1C 5.2 06/25/2019   HGBA1C 5.2 07/13/2018   Lab Results  Component Value Date   INSULIN 11.3 06/25/2019   INSULIN 15.4 07/13/2018   Lab Results  Component Value Date   TSH 0.477 07/13/2018   Lab Results  Component Value Date   CHOL 190 06/25/2019   HDL 46 06/25/2019   LDLCALC 133 (H) 06/25/2019   TRIG 57 06/25/2019   Lab Results  Component Value Date   WBC 7.3 07/13/2018  HGB 13.2 07/13/2018   HCT 38.1 07/13/2018   MCV 88 07/13/2018   No results found for: IRON, TIBC, FERRITIN  Attestation Statements:   Reviewed by clinician on day of visit: allergies, medications, problem list, medical history, surgical history, family history, social history, and previous encounter notes.  Time spent on visit including pre-visit chart review and post-visit charting and care was 30 minutes.   IMichaelene Song, am acting as transcriptionist for Abby Potash, PA-C   I have reviewed the above documentation for accuracy and completeness, and I agree with the above. Abby Potash, PA-C

## 2019-09-03 ENCOUNTER — Encounter (INDEPENDENT_AMBULATORY_CARE_PROVIDER_SITE_OTHER): Payer: Self-pay | Admitting: Physician Assistant

## 2019-09-03 DIAGNOSIS — M0579 Rheumatoid arthritis with rheumatoid factor of multiple sites without organ or systems involvement: Secondary | ICD-10-CM | POA: Diagnosis not present

## 2019-09-03 DIAGNOSIS — M79675 Pain in left toe(s): Secondary | ICD-10-CM | POA: Diagnosis not present

## 2019-09-04 NOTE — Telephone Encounter (Signed)
Please review

## 2019-09-05 ENCOUNTER — Ambulatory Visit (INDEPENDENT_AMBULATORY_CARE_PROVIDER_SITE_OTHER): Payer: BC Managed Care – PPO | Admitting: Family Medicine

## 2019-09-05 ENCOUNTER — Other Ambulatory Visit: Payer: Self-pay

## 2019-09-05 ENCOUNTER — Encounter (INDEPENDENT_AMBULATORY_CARE_PROVIDER_SITE_OTHER): Payer: Self-pay | Admitting: Family Medicine

## 2019-09-05 VITALS — BP 137/84 | HR 95 | Temp 98.2°F | Ht 66.0 in | Wt 199.0 lb

## 2019-09-05 DIAGNOSIS — E669 Obesity, unspecified: Secondary | ICD-10-CM

## 2019-09-05 DIAGNOSIS — E8881 Metabolic syndrome: Secondary | ICD-10-CM | POA: Diagnosis not present

## 2019-09-05 DIAGNOSIS — Z6832 Body mass index (BMI) 32.0-32.9, adult: Secondary | ICD-10-CM

## 2019-09-05 DIAGNOSIS — E7849 Other hyperlipidemia: Secondary | ICD-10-CM | POA: Diagnosis not present

## 2019-09-05 NOTE — Progress Notes (Signed)
Chief Complaint:   OBESITY Briana Brown is here to discuss her progress with her obesity treatment plan along with follow-up of her obesity related diagnoses. Briana Brown is on keeping a food journal and adhering to recommended goals of 1200-1250 calories and 85 grams of protein and states she is following her eating plan. Briana Brown states she is walking or biking for 30 minutes 5-6 times per week.  Today's visit was #: 78 Starting weight: 213 lbs Starting date: 07/13/2018 Today's weight: 199 lbs Today's date: 09/05/2019 Total lbs lost to date: 14 lbs Total lbs lost since last in-office visit: 2 lbs  Interim History: Briana Brown has been on prednisone x2 and is trying to drink more water and get vegetables available and ready for consumption.  She is going to [employer] and is planning on grocery shopping while there.  She is concerned about indulgent eating, but knows 1 meal daily will be in a park at a restaurant.   Subjective:   1. Other hyperlipidemia Briana Brown has hyperlipidemia and has been trying to improve her cholesterol levels with intensive lifestyle modification including a low saturated fat diet, exercise and weight loss. She denies any chest pain, claudication or myalgias.  She is not on a statin.  Lab Results  Component Value Date   ALT 12 06/25/2019   AST 13 06/25/2019   ALKPHOS 100 06/25/2019   BILITOT 0.3 06/25/2019   Lab Results  Component Value Date   CHOL 190 06/25/2019   HDL 46 06/25/2019   LDLCALC 133 (H) 06/25/2019   TRIG 57 06/25/2019   2. Insulin resistance Briana Brown has a diagnosis of insulin resistance based on her elevated fasting insulin level >5. She continues to work on diet and exercise to decrease her risk of diabetes.  She is not taking metformin.  Lab Results  Component Value Date   INSULIN 11.3 06/25/2019   INSULIN 15.4 07/13/2018   Lab Results  Component Value Date   HGBA1C 5.2 06/25/2019   Assessment/Plan:   1. Other hyperlipidemia Cardiovascular  risk and specific lipid/LDL goals reviewed.  We discussed several lifestyle modifications today and Maykala will continue to work on diet, exercise and weight loss efforts. Orders and follow up as documented in patient record.  Will repeat labs in 1 month.  Counseling Intensive lifestyle modifications are the first line treatment for this issue. . Dietary changes: Increase soluble fiber. Decrease simple carbohydrates. . Exercise changes: Moderate to vigorous-intensity aerobic activity 150 minutes per week if tolerated. . Lipid-lowering medications: see documented in medical record.  2. Insulin resistance Jennesa will continue to work on weight loss, exercise, and decreasing simple carbohydrates to help decrease the risk of diabetes. Briana Brown agreed to follow-up with [name] as directed to closely monitor her progress.  Will check labs in 1 month.  3. Class 1 obesity with serious comorbidity and body mass index (BMI) of 32.0 to 32.9 in adult, unspecified obesity type Briana Brown is currently in the action stage of change. As such, her goal is to continue with weight loss efforts. She has agreed to keeping a food journal and adhering to recommended goals of 1200-1250 calories and 85+ grams of protein.   Exercise goals: All adults should avoid inactivity. Some physical activity is better than none, and adults who participate in any amount of physical activity gain some health benefits.  Behavioral modification strategies: increasing lean protein intake, increasing vegetables, meal planning and cooking strategies, keeping healthy foods in the home and planning for success.  Briana Brown has  agreed to follow-up with our clinic in 3 weeks. She was informed of the importance of frequent follow-up visits to maximize her success with intensive lifestyle modifications for her multiple health conditions.   Objective:   Blood pressure 137/84, pulse 95, temperature 98.2 F (36.8 C), temperature source Oral, height 5\' 6"   (1.676 m), weight 199 lb (90.3 kg), SpO2 95 %. Body mass index is 32.12 kg/m.  General: Cooperative, alert, well developed, in no acute distress. HEENT: Conjunctivae and lids unremarkable. Cardiovascular: Regular rhythm.  Lungs: Normal work of breathing. Neurologic: No focal deficits.   Lab Results  Component Value Date   CREATININE 0.77 06/25/2019   BUN 10 06/25/2019   NA 141 06/25/2019   K 4.4 06/25/2019   CL 105 06/25/2019   CO2 25 06/25/2019   Lab Results  Component Value Date   ALT 12 06/25/2019   AST 13 06/25/2019   ALKPHOS 100 06/25/2019   BILITOT 0.3 06/25/2019   Lab Results  Component Value Date   HGBA1C 5.2 06/25/2019   HGBA1C 5.2 07/13/2018   Lab Results  Component Value Date   INSULIN 11.3 06/25/2019   INSULIN 15.4 07/13/2018   Lab Results  Component Value Date   TSH 0.477 07/13/2018   Lab Results  Component Value Date   CHOL 190 06/25/2019   HDL 46 06/25/2019   LDLCALC 133 (H) 06/25/2019   TRIG 57 06/25/2019   Lab Results  Component Value Date   WBC 7.3 07/13/2018   HGB 13.2 07/13/2018   HCT 38.1 07/13/2018   MCV 88 07/13/2018   Attestation Statements:   Reviewed by clinician on day of visit: allergies, medications, problem list, medical history, surgical history, family history, social history, and previous encounter notes.  Time spent on visit including pre-visit chart review and post-visit care and charting was 15 minutes.   I, Water quality scientist, CMA, am acting as transcriptionist for Coralie Common, MD.  I have reviewed the above documentation for accuracy and completeness, and I agree with the above. - Ilene Qua, MD

## 2019-09-24 ENCOUNTER — Ambulatory Visit (INDEPENDENT_AMBULATORY_CARE_PROVIDER_SITE_OTHER): Payer: BC Managed Care – PPO | Admitting: Physician Assistant

## 2019-09-24 ENCOUNTER — Encounter (INDEPENDENT_AMBULATORY_CARE_PROVIDER_SITE_OTHER): Payer: Self-pay | Admitting: Physician Assistant

## 2019-09-24 ENCOUNTER — Other Ambulatory Visit: Payer: Self-pay

## 2019-09-24 VITALS — BP 124/81 | HR 86 | Temp 98.4°F | Ht 66.0 in | Wt 198.0 lb

## 2019-09-24 DIAGNOSIS — Z6832 Body mass index (BMI) 32.0-32.9, adult: Secondary | ICD-10-CM

## 2019-09-24 DIAGNOSIS — E669 Obesity, unspecified: Secondary | ICD-10-CM

## 2019-09-24 DIAGNOSIS — E7849 Other hyperlipidemia: Secondary | ICD-10-CM

## 2019-09-24 NOTE — Progress Notes (Signed)
Chief Complaint:   OBESITY Briana Brown is here to discuss her progress with her obesity treatment plan along with follow-up of her obesity related diagnoses. Briana Brown is on keeping a food journal and adhering to recommended goals of 1200-1250 calories and 85 grams of protein and states she is following her eating plan approximately 90% of the time. Briana Brown states she is walking and resistance training for 30 minutes 7 times per week.  Today's visit was #: 14 Starting weight: 213 lbs Starting date: 07/13/2018 Today's weight: 198 lbs Today's date: 09/24/2019 Total lbs lost to date: 15 lbs Total lbs lost since last in-office visit: 1 lb  Interim History: Briana Brown just returned from [place] and did a very good job making good food choices and with portion control.  She wants to start lifting weights.  Subjective:   1. Other hyperlipidemia Briana Brown has hyperlipidemia and has been trying to improve her cholesterol levels with intensive lifestyle modification including a low saturated fat diet, exercise and weight loss. She denies any chest pain, claudication or myalgias.  She is on no medications.  Lab Results  Component Value Date   ALT 12 06/25/2019   AST 13 06/25/2019   ALKPHOS 100 06/25/2019   BILITOT 0.3 06/25/2019   Lab Results  Component Value Date   CHOL 190 06/25/2019   HDL 46 06/25/2019   LDLCALC 133 (H) 06/25/2019   TRIG 57 06/25/2019   Assessment/Plan:   1. Other hyperlipidemia Cardiovascular risk and specific lipid/LDL goals reviewed.  We discussed several lifestyle modifications today and Legacee will continue to work on diet, exercise and weight loss efforts. Orders and follow up as documented in patient record.   Counseling Intensive lifestyle modifications are the first line treatment for this issue. . Dietary changes: Increase soluble fiber. Decrease simple carbohydrates. . Exercise changes: Moderate to vigorous-intensity aerobic activity 150 minutes per week if  tolerated. . Lipid-lowering medications: see documented in medical record.  2. Class 1 obesity with serious comorbidity and body mass index (BMI) of 32.0 to 32.9 in adult, unspecified obesity type Briana Brown is currently in the action stage of change. As such, her goal is to continue with weight loss efforts. She has agreed to keeping a food journal and adhering to recommended goals of 1200-1250 calories and 85 grams of protein daily.   Exercise goals: As is.  Behavioral modification strategies: meal planning and cooking strategies and keeping healthy foods in the home.  Briana Brown has agreed to follow-up with our clinic in 2 weeks. She was informed of the importance of frequent follow-up visits to maximize her success with intensive lifestyle modifications for her multiple health conditions.   Objective:   Blood pressure 124/81, pulse 86, temperature 98.4 F (36.9 C), temperature source Oral, height 5\' 6"  (1.676 m), weight 198 lb (89.8 kg), SpO2 100 %. Body mass index is 31.96 kg/m.  General: Cooperative, alert, well developed, in no acute distress. HEENT: Conjunctivae and lids unremarkable. Cardiovascular: Regular rhythm.  Lungs: Normal work of breathing. Neurologic: No focal deficits.   Lab Results  Component Value Date   CREATININE 0.77 06/25/2019   BUN 10 06/25/2019   NA 141 06/25/2019   K 4.4 06/25/2019   CL 105 06/25/2019   CO2 25 06/25/2019   Lab Results  Component Value Date   ALT 12 06/25/2019   AST 13 06/25/2019   ALKPHOS 100 06/25/2019   BILITOT 0.3 06/25/2019   Lab Results  Component Value Date   HGBA1C 5.2 06/25/2019  HGBA1C 5.2 07/13/2018   Lab Results  Component Value Date   INSULIN 11.3 06/25/2019   INSULIN 15.4 07/13/2018   Lab Results  Component Value Date   TSH 0.477 07/13/2018   Lab Results  Component Value Date   CHOL 190 06/25/2019   HDL 46 06/25/2019   LDLCALC 133 (H) 06/25/2019   TRIG 57 06/25/2019   Lab Results  Component Value Date    WBC 7.3 07/13/2018   HGB 13.2 07/13/2018   HCT 38.1 07/13/2018   MCV 88 07/13/2018   Attestation Statements:   Reviewed by clinician on day of visit: allergies, medications, problem list, medical history, surgical history, family history, social history, and previous encounter notes.  Time spent on visit including pre-visit chart review and post-visit care and charting was 31 minutes.   I, Water quality scientist, CMA, am acting as Location manager for [employer], PA-C.  I have reviewed the above documentation for accuracy and completeness, and I agree with the above. Abby Potash, PA-C

## 2019-10-08 DIAGNOSIS — M0579 Rheumatoid arthritis with rheumatoid factor of multiple sites without organ or systems involvement: Secondary | ICD-10-CM | POA: Diagnosis not present

## 2019-10-10 ENCOUNTER — Ambulatory Visit (INDEPENDENT_AMBULATORY_CARE_PROVIDER_SITE_OTHER): Payer: BC Managed Care – PPO | Admitting: Physician Assistant

## 2019-10-10 ENCOUNTER — Encounter (INDEPENDENT_AMBULATORY_CARE_PROVIDER_SITE_OTHER): Payer: Self-pay | Admitting: Physician Assistant

## 2019-10-10 ENCOUNTER — Other Ambulatory Visit: Payer: Self-pay

## 2019-10-10 VITALS — BP 132/77 | HR 79 | Temp 98.0°F | Ht 66.0 in | Wt 197.0 lb

## 2019-10-10 DIAGNOSIS — E559 Vitamin D deficiency, unspecified: Secondary | ICD-10-CM

## 2019-10-10 DIAGNOSIS — E669 Obesity, unspecified: Secondary | ICD-10-CM | POA: Diagnosis not present

## 2019-10-10 DIAGNOSIS — Z6831 Body mass index (BMI) 31.0-31.9, adult: Secondary | ICD-10-CM | POA: Diagnosis not present

## 2019-10-10 NOTE — Progress Notes (Signed)
Chief Complaint:   OBESITY Briana Brown is here to discuss her progress with her obesity treatment plan along with follow-up of her obesity related diagnoses. Briana Brown is keeping a food journal and adhering to recommended goals of 1200-1250 calories and 85 grams of protein and states she is following her eating plan approximately 99% of the time. Briana Brown states she is walking/stationary bike 30 minutes 3 times per week.  Today's visit was #: 15 Starting weight: 213 lbs Starting date: 07/13/2018 Today's weight: 197 lbs Today's date: 10/10/2019 Total lbs lost to date: 16 Total lbs lost since last in-office visit: 1  Interim History: [name] states that she has had no energy and has had generalized body pain after her second COVID vaccination. She had her RA infusion this past week. She has not been exercising like she planned to. She states she went slightly over her calories a few days due to eating chocolate.  Subjective:   Vitamin D deficiency. [name] is on Vitamin D supplementation. No nausea, vomiting, or muscle weakness. Last Vitamin D 37.5 on 06/25/2019.  Assessment/Plan:   Vitamin D deficiency. Low Vitamin D level contributes to fatigue and are associated with obesity, breast, and colon cancer. She agrees to continue to take Vitamin D and will follow-up for routine testing of Vitamin D, at least 2-3 times per year to avoid over-replacement.  Class 1 obesity with serious comorbidity and body mass index (BMI) of 31.0 to 31.9 in adult, unspecified obesity type.  Briana Brown is currently in the action stage of change. As such, her goal is to continue with weight loss efforts. She has agreed to keeping a food journal and adhering to recommended goals of 1200-1250 calories and 85 grams of protein daily.   Exercise goals: For substantial health benefits, adults should do at least 150 minutes (2 hours and 30 minutes) a week of moderate-intensity, or 75 minutes (1 hour and 15 minutes) a week  of vigorous-intensity aerobic physical activity, or an equivalent combination of moderate- and vigorous-intensity aerobic activity. Aerobic activity should be performed in episodes of at least 10 minutes, and preferably, it should be spread throughout the week.  Behavioral modification strategies: meal planning and cooking strategies and travel eating strategies.  Briana Brown has agreed to follow-up with our clinic in 2-3 weeks. She was informed of the importance of frequent follow-up visits to maximize her success with intensive lifestyle modifications for her multiple health conditions.   Objective:   Blood pressure 132/77, pulse 79, temperature 98 F (36.7 C), temperature source Oral, height 5\' 6"  (1.676 m), weight 197 lb (89.4 kg), SpO2 98 %. Body mass index is 31.8 kg/m.  General: Cooperative, alert, well developed, in no acute distress. HEENT: Conjunctivae and lids unremarkable. Cardiovascular: Regular rhythm.  Lungs: Normal work of breathing. Neurologic: No focal deficits.   Lab Results  Component Value Date   CREATININE 0.77 06/25/2019   BUN 10 06/25/2019   NA 141 06/25/2019   K 4.4 06/25/2019   CL 105 06/25/2019   CO2 25 06/25/2019   Lab Results  Component Value Date   ALT 12 06/25/2019   AST 13 06/25/2019   ALKPHOS 100 06/25/2019   BILITOT 0.3 06/25/2019   Lab Results  Component Value Date   HGBA1C 5.2 06/25/2019   HGBA1C 5.2 07/13/2018   Lab Results  Component Value Date   INSULIN 11.3 06/25/2019   INSULIN 15.4 07/13/2018   Lab Results  Component Value Date   TSH 0.477 07/13/2018  Lab Results  Component Value Date   CHOL 190 06/25/2019   HDL 46 06/25/2019   LDLCALC 133 (H) 06/25/2019   TRIG 57 06/25/2019   Lab Results  Component Value Date   WBC 7.3 07/13/2018   HGB 13.2 07/13/2018   HCT 38.1 07/13/2018   MCV 88 07/13/2018   No results found for: IRON, TIBC, FERRITIN  today.  Attestation Statements:   Reviewed by clinician on day of visit:  allergies, medications, problem list, medical history, surgical history, family history, social history, and previous encounter notes.  Time spent on visit including pre-visit chart review and post-visit charting and care was 34 minutes.   IMichaelene Song, am acting as transcriptionist for Abby Potash, PA-C   I have reviewed the above documentation for accuracy and completeness, and I agree with the above. Abby Potash, PA-C

## 2019-10-30 ENCOUNTER — Ambulatory Visit (INDEPENDENT_AMBULATORY_CARE_PROVIDER_SITE_OTHER): Payer: BC Managed Care – PPO | Admitting: Physician Assistant

## 2019-11-15 ENCOUNTER — Encounter (INDEPENDENT_AMBULATORY_CARE_PROVIDER_SITE_OTHER): Payer: Self-pay | Admitting: Physician Assistant

## 2019-11-15 NOTE — Telephone Encounter (Signed)
Please r/s

## 2019-11-19 ENCOUNTER — Ambulatory Visit (INDEPENDENT_AMBULATORY_CARE_PROVIDER_SITE_OTHER): Payer: BC Managed Care – PPO | Admitting: Physician Assistant

## 2019-12-03 DIAGNOSIS — M0579 Rheumatoid arthritis with rheumatoid factor of multiple sites without organ or systems involvement: Secondary | ICD-10-CM | POA: Diagnosis not present

## 2019-12-04 ENCOUNTER — Ambulatory Visit (INDEPENDENT_AMBULATORY_CARE_PROVIDER_SITE_OTHER): Payer: BC Managed Care – PPO | Admitting: Physician Assistant

## 2019-12-04 ENCOUNTER — Other Ambulatory Visit: Payer: Self-pay

## 2019-12-04 ENCOUNTER — Encounter (INDEPENDENT_AMBULATORY_CARE_PROVIDER_SITE_OTHER): Payer: Self-pay | Admitting: Physician Assistant

## 2019-12-04 VITALS — BP 123/82 | HR 82 | Temp 98.2°F | Ht 66.0 in | Wt 197.0 lb

## 2019-12-04 DIAGNOSIS — Z6831 Body mass index (BMI) 31.0-31.9, adult: Secondary | ICD-10-CM | POA: Diagnosis not present

## 2019-12-04 DIAGNOSIS — E669 Obesity, unspecified: Secondary | ICD-10-CM | POA: Diagnosis not present

## 2019-12-04 DIAGNOSIS — E7849 Other hyperlipidemia: Secondary | ICD-10-CM | POA: Diagnosis not present

## 2019-12-05 NOTE — Progress Notes (Signed)
Chief Complaint:   OBESITY Briana Brown is here to discuss her progress with her obesity treatment plan along with follow-up of her obesity related diagnoses. Briana Brown is on keeping a food journal and adhering to recommended goals of 1200-1250 calories and 85 grams of protein and states she is following her eating plan approximately 90% of the time. Briana Brown states she is swimming and lifting light weights for 30-45 minutes 4 times daily.  Today's visit was #: 16 Starting weight: 213 lbs Starting date: 07/13/2018 Today's weight: 197 lbs Today's date: 12/04/2019 Total lbs lost to date: 16 lbs Total lbs lost since last in-office visit: 0  Interim History: Briana Brown states that she does well with breakfast and lunch but struggles with dinner.  She is going to have labs drawn with her PCP next month.  Subjective:   1. Other hyperlipidemia Briana Brown has hyperlipidemia and has been trying to improve her cholesterol levels with intensive lifestyle modification including a low saturated fat diet, exercise and weight loss. She denies any chest pain, claudication or myalgias.  She is not on any medication.  Lab Results  Component Value Date   ALT 12 06/25/2019   AST 13 06/25/2019   ALKPHOS 100 06/25/2019   BILITOT 0.3 06/25/2019   Lab Results  Component Value Date   CHOL 190 06/25/2019   HDL 46 06/25/2019   LDLCALC 133 (H) 06/25/2019   TRIG 57 06/25/2019   Assessment/Plan:   1. Other hyperlipidemia Cardiovascular risk and specific lipid/LDL goals reviewed.  We discussed several lifestyle modifications today and Briana Brown will continue to work on diet, exercise and weight loss efforts. Orders and follow up as documented in patient record.   Counseling Intensive lifestyle modifications are the first line treatment for this issue. . Dietary changes: Increase soluble fiber. Decrease simple carbohydrates. . Exercise changes: Moderate to vigorous-intensity aerobic activity 150 minutes per week if  tolerated. . Lipid-lowering medications: see documented in medical record.  2. Class 1 obesity with serious comorbidity and body mass index (BMI) of 31.0 to 31.9 in adult, unspecified obesity type Briana Brown is currently in the action stage of change. As such, her goal is to continue with weight loss efforts. She has agreed to keeping a food journal and adhering to recommended goals of 1200-1250 calories and 85 grams of protein.   Exercise goals: As is.  Behavioral modification strategies: increasing lean protein intake and no skipping meals.  Briana Brown has agreed to follow-up with our clinic in 2 weeks. She was informed of the importance of frequent follow-up visits to maximize her success with intensive lifestyle modifications for her multiple health conditions.   Objective:   Blood pressure 123/82, pulse 82, temperature 98.2 F (36.8 C), temperature source Oral, height 5\' 6"  (1.676 m), weight 197 lb (89.4 kg), SpO2 99 %. Body mass index is 31.8 kg/m.  General: Cooperative, alert, well developed, in no acute distress. HEENT: Conjunctivae and lids unremarkable. Cardiovascular: Regular rhythm.  Lungs: Normal work of breathing. Neurologic: No focal deficits.   Lab Results  Component Value Date   CREATININE 0.77 06/25/2019   BUN 10 06/25/2019   NA 141 06/25/2019   K 4.4 06/25/2019   CL 105 06/25/2019   CO2 25 06/25/2019   Lab Results  Component Value Date   ALT 12 06/25/2019   AST 13 06/25/2019   ALKPHOS 100 06/25/2019   BILITOT 0.3 06/25/2019   Lab Results  Component Value Date   HGBA1C 5.2 06/25/2019   HGBA1C 5.2 07/13/2018  Lab Results  Component Value Date   INSULIN 11.3 06/25/2019   INSULIN 15.4 07/13/2018   Lab Results  Component Value Date   TSH 0.477 07/13/2018   Lab Results  Component Value Date   CHOL 190 06/25/2019   HDL 46 06/25/2019   LDLCALC 133 (H) 06/25/2019   TRIG 57 06/25/2019   Lab Results  Component Value Date   WBC 7.3 07/13/2018   HGB  13.2 07/13/2018   HCT 38.1 07/13/2018   MCV 88 07/13/2018   Attestation Statements:   Reviewed by clinician on day of visit: allergies, medications, problem list, medical history, surgical history, family history, social history, and previous encounter notes.  Time spent on visit including pre-visit chart review and post-visit care and charting was 30 minutes.   I, Water quality scientist, CMA, am acting as transcriptionist for Abby Potash, PA-C  I have reviewed the above documentation for accuracy and completeness, and I agree with the above. Abby Potash, PA-C

## 2019-12-19 ENCOUNTER — Ambulatory Visit (INDEPENDENT_AMBULATORY_CARE_PROVIDER_SITE_OTHER): Payer: BC Managed Care – PPO | Admitting: Physician Assistant

## 2019-12-19 ENCOUNTER — Encounter (INDEPENDENT_AMBULATORY_CARE_PROVIDER_SITE_OTHER): Payer: Self-pay | Admitting: Physician Assistant

## 2019-12-19 ENCOUNTER — Other Ambulatory Visit: Payer: Self-pay

## 2019-12-19 VITALS — BP 114/78 | HR 83 | Temp 98.0°F | Ht 66.0 in | Wt 197.0 lb

## 2019-12-19 DIAGNOSIS — E559 Vitamin D deficiency, unspecified: Secondary | ICD-10-CM

## 2019-12-19 DIAGNOSIS — Z6831 Body mass index (BMI) 31.0-31.9, adult: Secondary | ICD-10-CM | POA: Diagnosis not present

## 2019-12-19 DIAGNOSIS — E669 Obesity, unspecified: Secondary | ICD-10-CM

## 2019-12-20 NOTE — Progress Notes (Signed)
Chief Complaint:   OBESITY Briana Brown is here to discuss her progress with her obesity treatment plan along with follow-up of her obesity related diagnoses. Briana Brown is on the Category 2 Plan and journaling 1200-1300 calories and 85 grams of protein and states she is following her eating plan approximately 95% of the time. Briana Brown states she is exercising 0 minutes 0 times per week.  Today's visit was #: 26 Starting weight: 213 lbs Starting date: 07/13/2018 Today's weight: 197 lbs Today's date: 12/19/2019 Total lbs lost to date: 16 Total lbs lost since last in-office visit: 0  Interim History: Briana Brown states that she followed the plan very well except for one special occasion. She has not been exercising due to an injury.  Subjective:    Vitamin D deficiency. [name] is on daily Vitamin D supplementation. No nausea, vomiting, or muscle weakness.   Ref. Range 06/25/2019 12:23  Vitamin D, 25-Hydroxy Latest Ref Range: 30.0 - 100.0 ng/mL 37.5   Assessment/Plan:   Vitamin D deficiency. Low Vitamin D level contributes to fatigue and are associated with obesity, breast, and colon cancer. She agrees to continue to take Vitamin D as directed and will follow-up for routine testing of Vitamin D, at least 2-3 times per year to avoid over-replacement.  Class 1 obesity with serious comorbidity and body mass index (BMI) of 31.0 to 31.9 in adult, unspecified obesity type.  Briana Brown is currently in the action stage of change. As such, her goal is to continue with weight loss efforts. She has agreed to keeping a food journal and adhering to recommended goals of 1250-1350 calories and 85 grams of protein daily.   Exercise goals: For substantial health benefits, adults should do at least 150 minutes (2 hours and 30 minutes) a week of moderate-intensity, or 75 minutes (1 hour and 15 minutes) a week of vigorous-intensity aerobic physical activity, or an equivalent combination of moderate- and  vigorous-intensity aerobic activity. Aerobic activity should be performed in episodes of at least 10 minutes, and preferably, it should be spread throughout the week.  Behavioral modification strategies: meal planning and cooking strategies and keeping healthy foods in the home.  Briana Brown has agreed to follow-up with our clinic in 3 weeks. She was informed of the importance of frequent follow-up visits to maximize her success with intensive lifestyle modifications for her multiple health conditions.   Objective:   Blood pressure 114/78, pulse 83, temperature 98 F (36.7 C), temperature source Oral, height 5\' 6"  (1.676 m), weight 197 lb (89.4 kg), SpO2 97 %. Body mass index is 31.8 kg/m.  General: Cooperative, alert, well developed, in no acute distress. HEENT: Conjunctivae and lids unremarkable. Cardiovascular: Regular rhythm.  Lungs: Normal work of breathing. Neurologic: No focal deficits.   Lab Results  Component Value Date   CREATININE 0.77 06/25/2019   BUN 10 06/25/2019   NA 141 06/25/2019   K 4.4 06/25/2019   CL 105 06/25/2019   CO2 25 06/25/2019   Lab Results  Component Value Date   ALT 12 06/25/2019   AST 13 06/25/2019   ALKPHOS 100 06/25/2019   BILITOT 0.3 06/25/2019   Lab Results  Component Value Date   HGBA1C 5.2 06/25/2019   HGBA1C 5.2 07/13/2018   Lab Results  Component Value Date   INSULIN 11.3 06/25/2019   INSULIN 15.4 07/13/2018   Lab Results  Component Value Date   TSH 0.477 07/13/2018   Lab Results  Component Value Date   CHOL 190 06/25/2019  HDL 46 06/25/2019   LDLCALC 133 (H) 06/25/2019   TRIG 57 06/25/2019   Lab Results  Component Value Date   WBC 7.3 07/13/2018   HGB 13.2 07/13/2018   HCT 38.1 07/13/2018   MCV 88 07/13/2018   No results found for: IRON, TIBC, FERRITIN  Attestation Statements:   Reviewed by clinician on day of visit: allergies, medications, problem list, medical history, surgical history, family history, social  history, and previous encounter notes.  Time spent on visit including pre-visit chart review and post-visit charting and care was 31 minutes.   IMichaelene Song, am acting as transcriptionist for Abby Potash, PA-C   I have reviewed the above documentation for accuracy and completeness, and I agree with the above. Abby Potash, PA-C

## 2020-01-10 ENCOUNTER — Encounter (INDEPENDENT_AMBULATORY_CARE_PROVIDER_SITE_OTHER): Payer: Self-pay | Admitting: Physician Assistant

## 2020-01-10 ENCOUNTER — Ambulatory Visit (INDEPENDENT_AMBULATORY_CARE_PROVIDER_SITE_OTHER): Payer: BC Managed Care – PPO | Admitting: Physician Assistant

## 2020-01-10 ENCOUNTER — Other Ambulatory Visit: Payer: Self-pay

## 2020-01-10 VITALS — BP 103/72 | HR 87 | Temp 98.0°F | Ht 66.0 in | Wt 196.0 lb

## 2020-01-10 DIAGNOSIS — E559 Vitamin D deficiency, unspecified: Secondary | ICD-10-CM

## 2020-01-10 DIAGNOSIS — R0602 Shortness of breath: Secondary | ICD-10-CM | POA: Diagnosis not present

## 2020-01-10 DIAGNOSIS — E669 Obesity, unspecified: Secondary | ICD-10-CM

## 2020-01-10 DIAGNOSIS — E7849 Other hyperlipidemia: Secondary | ICD-10-CM

## 2020-01-10 DIAGNOSIS — Z9189 Other specified personal risk factors, not elsewhere classified: Secondary | ICD-10-CM | POA: Diagnosis not present

## 2020-01-10 DIAGNOSIS — R739 Hyperglycemia, unspecified: Secondary | ICD-10-CM | POA: Diagnosis not present

## 2020-01-10 DIAGNOSIS — Z6831 Body mass index (BMI) 31.0-31.9, adult: Secondary | ICD-10-CM

## 2020-01-10 NOTE — Progress Notes (Signed)
Chief Complaint:   OBESITY Briana Brown is here to discuss her progress with her obesity treatment plan along with follow-up of her obesity related diagnoses. [name] is on the Category 2 Plan and states she is following her eating plan approximately 95% of the time. [name] states she is walking/swimming/weights 30 minutes 7 times per week.  Today's visit was #: 40 Starting weight: 213 lbs Starting date: 07/13/2018 Today's weight: 196 lbs Today's date: 01/10/2020 Total lbs lost to date: 17 Total lbs lost since last in-office visit: 1  Interim History: [name]'s IC today shows an increase in her metabolism to 2192. She states that she is sometimes hungry between meals. She is exercising regularly. She is traveling to the beach next week.  Subjective:   Shortness of breath on exertion. [name] notes increasing shortness of breath with exercising and seems to be worsening over time with weight gain. She notes getting out of breath sooner with activity than she used to. [name] denies shortness of breath at rest or orthopnea. No dizziness or lightheadedness.  Hyperglycemia. [name] has a history of some elevated blood glucose readings without a diagnosis of diabetes. She admits to polyphagia. Khloei is on no medication. She is exercising regularly.  Other hyperlipidemia. Reniyah has hyperlipidemia and has been trying to improve her cholesterol levels with intensive lifestyle modification including a low saturated fat diet, exercise and weight loss. She denies any chest pain. [name] is on no medication. She is exercising regularly.  Lab Results  Component Value Date   ALT 12 06/25/2019   AST 13 06/25/2019   ALKPHOS 100 06/25/2019   BILITOT 0.3 06/25/2019   Lab Results  Component Value Date   CHOL 190 06/25/2019   HDL 46 06/25/2019   LDLCALC 133 (H) 06/25/2019   TRIG 57 06/25/2019   Vitamin D deficiency. [name] is on Vitamin D 3,000 units daily, which she is tolerating  well.   Ref. Range 06/25/2019 12:23  Vitamin D, 25-Hydroxy Latest Ref Range: 30.0 - 100.0 ng/mL 37.5   At risk for heart disease. [name] is at a higher than average risk for cardiovascular disease due to obesity.   Assessment/Plan:   Shortness of breath on exertion. Connor's shortness of breath appears to be obesity related and exercise induced. She has agreed to work on weight loss and gradually increase exercise to treat her exercise induced shortness of breath. Will continue to monitor closely. IC was checked today.  Hyperglycemia. Fasting labs will be obtained and results with be discussed with Curt Bears in 2 weeks at her follow up visit. In the meanwhile [name] was started on a lower simple carbohydrate diet and will work on weight loss efforts. Comprehensive metabolic panel, Hemoglobin A1c, Insulin, random labs will be checked today.   Other hyperlipidemia. Cardiovascular risk and specific lipid/LDL goals reviewed.  We discussed several lifestyle modifications today and [name] will continue to work on diet, exercise and weight loss efforts. Orders and follow up as documented in patient record. Lipid panel will be checked today.  Counseling Intensive lifestyle modifications are the first line treatment for this issue. . Dietary changes: Increase soluble fiber. Decrease simple carbohydrates. . Exercise changes: Moderate to vigorous-intensity aerobic activity 150 minutes per week if tolerated. . Lipid-lowering medications: see documented in medical record.   Vitamin D deficiency. Low Vitamin D level contributes to fatigue and are associated with obesity, breast, and colon cancer. She agrees to continue to take Vitamin D as directed and VITAMIN D 25 Hydroxy (Vit-D  Deficiency, Fractures) level will be checked today.  At risk for heart disease. [name] was given approximately 15 minutes of coronary artery disease prevention counseling today. She is 39 y.o. female and has risk factors for  heart disease including obesity. We discussed intensive lifestyle modifications today with an emphasis on specific weight loss instructions and strategies.   Repetitive spaced learning was employed today to elicit superior memory formation and behavioral change.  Class 1 obesity with serious comorbidity and body mass index (BMI) of 31.0 to 31.9 in adult, unspecified obesity type.  [name] is currently in the action stage of change. As such, her goal is to continue with weight loss efforts. She has agreed to change plans and will now follow the Category 3 Plan.   Exercise goals: For substantial health benefits, adults should do at least 150 minutes (2 hours and 30 minutes) a week of moderate-intensity, or 75 minutes (1 hour and 15 minutes) a week of vigorous-intensity aerobic physical activity, or an equivalent combination of moderate- and vigorous-intensity aerobic activity. Aerobic activity should be performed in episodes of at least 10 minutes, and preferably, it should be spread throughout the week.  Behavioral modification strategies: increasing lean protein intake and meal planning and cooking strategies.  [name] has agreed to follow-up with our clinic in 3 weeks. She was informed of the importance of frequent follow-up visits to maximize her success with intensive lifestyle modifications for her multiple health conditions.   [name] was informed we would discuss her lab results at her next visit unless there is a critical issue that needs to be addressed sooner. [name] agreed to keep her next visit at the agreed upon time to discuss these results.  Objective:   Blood pressure 103/72, pulse 87, temperature 98 F (36.7 C), temperature source Oral, height 5\' 6"  (1.676 m), weight 196 lb (88.9 kg), SpO2 97 %. Body mass index is 31.64 kg/m.  General: Cooperative, alert, well developed, in no acute distress. HEENT: Conjunctivae and lids unremarkable. Cardiovascular: Regular rhythm.  Lungs:  Normal work of breathing. Neurologic: No focal deficits.   Lab Results  Component Value Date   CREATININE 0.77 06/25/2019   BUN 10 06/25/2019   NA 141 06/25/2019   K 4.4 06/25/2019   CL 105 06/25/2019   CO2 25 06/25/2019   Lab Results  Component Value Date   ALT 12 06/25/2019   AST 13 06/25/2019   ALKPHOS 100 06/25/2019   BILITOT 0.3 06/25/2019   Lab Results  Component Value Date   HGBA1C 5.2 06/25/2019   HGBA1C 5.2 07/13/2018   Lab Results  Component Value Date   INSULIN 11.3 06/25/2019   INSULIN 15.4 07/13/2018   Lab Results  Component Value Date   TSH 0.477 07/13/2018   Lab Results  Component Value Date   CHOL 190 06/25/2019   HDL 46 06/25/2019   LDLCALC 133 (H) 06/25/2019   TRIG 57 06/25/2019   Lab Results  Component Value Date   WBC 7.3 07/13/2018   HGB 13.2 07/13/2018   HCT 38.1 07/13/2018   MCV 88 07/13/2018   No results found for: IRON, TIBC, FERRITIN  Attestation Statements:   Reviewed by clinician on day of visit: allergies, medications, problem list, medical history, surgical history, family history, social history, and previous encounter notes.  IMichaelene Song, am acting as transcriptionist for Abby Potash, PA-C   I have reviewed the above documentation for accuracy and completeness, and I agree with the above. Abby Potash, PA-C

## 2020-01-28 DIAGNOSIS — Z13220 Encounter for screening for lipoid disorders: Secondary | ICD-10-CM | POA: Diagnosis not present

## 2020-01-28 DIAGNOSIS — M0579 Rheumatoid arthritis with rheumatoid factor of multiple sites without organ or systems involvement: Secondary | ICD-10-CM | POA: Diagnosis not present

## 2020-01-28 DIAGNOSIS — Z79899 Other long term (current) drug therapy: Secondary | ICD-10-CM | POA: Diagnosis not present

## 2020-01-28 DIAGNOSIS — Z111 Encounter for screening for respiratory tuberculosis: Secondary | ICD-10-CM | POA: Diagnosis not present

## 2020-01-31 ENCOUNTER — Ambulatory Visit (INDEPENDENT_AMBULATORY_CARE_PROVIDER_SITE_OTHER): Payer: BC Managed Care – PPO | Admitting: Physician Assistant

## 2020-01-31 ENCOUNTER — Other Ambulatory Visit: Payer: Self-pay

## 2020-01-31 ENCOUNTER — Encounter (INDEPENDENT_AMBULATORY_CARE_PROVIDER_SITE_OTHER): Payer: Self-pay | Admitting: Physician Assistant

## 2020-01-31 VITALS — BP 116/80 | HR 88 | Temp 98.6°F | Ht 66.0 in | Wt 198.0 lb

## 2020-01-31 DIAGNOSIS — E669 Obesity, unspecified: Secondary | ICD-10-CM

## 2020-01-31 DIAGNOSIS — E559 Vitamin D deficiency, unspecified: Secondary | ICD-10-CM | POA: Diagnosis not present

## 2020-01-31 DIAGNOSIS — E7849 Other hyperlipidemia: Secondary | ICD-10-CM | POA: Diagnosis not present

## 2020-01-31 DIAGNOSIS — Z6832 Body mass index (BMI) 32.0-32.9, adult: Secondary | ICD-10-CM

## 2020-02-04 NOTE — Progress Notes (Signed)
Chief Complaint:   OBESITY Briana Brown is here to discuss her progress with her obesity treatment plan along with follow-up of her obesity related diagnoses. [name] is on the Category 3 Plan and states she is following her eating plan approximately 50% of the time. [name] states she is walking 20 minutes 3 times per week.  Today's visit was #: 26 Starting weight: 213 lbs Starting date: 07/13/2018 Today's weight: 198 lbs Today's date: 01/31/2020 Total lbs lost to date: 15 Total lbs lost since last in-office visit: 0  Interim History: Briana Brown reports that she has had trouble adjusting to the Category 3 meal plan and the amount of protein she is supposed to get. She had labs drawn last week, but unable to access these today. She will bring in copies to her next office visit.  Subjective:   Vitamin D deficiency. Briana Brown is on Vitamin D supplementation. No nausea, vomiting, or muscle weakness. She had labs drawn last week, but we do not have the results available today.   Ref. Range 06/25/2019 12:23  Vitamin D, 25-Hydroxy Latest Ref Range: 30.0 - 100.0 ng/mL 37.5   Other hyperlipidemia. Briana Brown has hyperlipidemia and has been trying to improve her cholesterol levels with intensive lifestyle modification including a low saturated fat diet, exercise and weight loss. She denies any chest pain, claudication or myalgias. Last LDL was elevated. Briana Brown is on no medication. She had recent labs from her PCP, but we do not have access to them.  Lab Results  Component Value Date   ALT 12 06/25/2019   AST 13 06/25/2019   ALKPHOS 100 06/25/2019   BILITOT 0.3 06/25/2019   Lab Results  Component Value Date   CHOL 190 06/25/2019   HDL 46 06/25/2019   LDLCALC 133 (H) 06/25/2019   TRIG 57 06/25/2019   Assessment/Plan:   Vitamin D deficiency. Low Vitamin D level contributes to fatigue and are associated with obesity, breast, and colon cancer. She agrees to continue to take Vitamin D as  directed and will follow-up for routine testing of Vitamin D, at least 2-3 times per year to avoid over-replacement.  Other hyperlipidemia. Cardiovascular risk and specific lipid/LDL goals reviewed.  We discussed several lifestyle modifications today and Durinda will continue to work on diet, exercise and weight loss efforts. Orders and follow up as documented in patient record.   Counseling Intensive lifestyle modifications are the first line treatment for this issue.  Dietary changes: Increase soluble fiber. Decrease simple carbohydrates.  Exercise changes: Moderate to vigorous-intensity aerobic activity 150 minutes per week if tolerated.  Lipid-lowering medications: see documented in medical record.  Class 1 obesity with serious comorbidity and body mass index (BMI) of 32.0 to 32.9 in adult, unspecified obesity type.  Briana Brown is currently in the action stage of change. As such, her goal is to continue with weight loss efforts. She has agreed to the Category 3 Plan and will journal 1500-1600 calories and 95 grams of protein daily.   Exercise goals: For substantial health benefits, adults should do at least 150 minutes (2 hours and 30 minutes) a week of moderate-intensity, or 75 minutes (1 hour and 15 minutes) a week of vigorous-intensity aerobic physical activity, or an equivalent combination of moderate- and vigorous-intensity aerobic activity. Aerobic activity should be performed in episodes of at least 10 minutes, and preferably, it should be spread throughout the week.  Behavioral modification strategies: increasing lean protein intake and no skipping meals.  Briana Brown has agreed to follow-up with  our clinic in 2 weeks. She was informed of the importance of frequent follow-up visits to maximize her success with intensive lifestyle modifications for her multiple health conditions.   Objective:   Blood pressure 116/80, pulse 88, temperature 98.6 F (37 C), temperature source Oral, height  5\' 6"  (1.676 m), weight 198 lb (89.8 kg), SpO2 96 %. Body mass index is 31.96 kg/m.  General: Cooperative, alert, well developed, in no acute distress. HEENT: Conjunctivae and lids unremarkable. Cardiovascular: Regular rhythm.  Lungs: Normal work of breathing. Neurologic: No focal deficits.   Lab Results  Component Value Date   CREATININE 0.77 06/25/2019   BUN 10 06/25/2019   NA 141 06/25/2019   K 4.4 06/25/2019   CL 105 06/25/2019   CO2 25 06/25/2019   Lab Results  Component Value Date   ALT 12 06/25/2019   AST 13 06/25/2019   ALKPHOS 100 06/25/2019   BILITOT 0.3 06/25/2019   Lab Results  Component Value Date   HGBA1C 5.2 06/25/2019   HGBA1C 5.2 07/13/2018   Lab Results  Component Value Date   INSULIN 11.3 06/25/2019   INSULIN 15.4 07/13/2018   Lab Results  Component Value Date   TSH 0.477 07/13/2018   Lab Results  Component Value Date   CHOL 190 06/25/2019   HDL 46 06/25/2019   LDLCALC 133 (H) 06/25/2019   TRIG 57 06/25/2019   Lab Results  Component Value Date   WBC 7.3 07/13/2018   HGB 13.2 07/13/2018   HCT 38.1 07/13/2018   MCV 88 07/13/2018   No results found for: IRON, TIBC, FERRITIN  Attestation Statements:   Reviewed by clinician on day of visit: allergies, medications, problem list, medical history, surgical history, family history, social history, and previous encounter notes.  Time spent on visit including pre-visit chart review and post-visit charting and care was 32 minutes.   IMichaelene Song, am acting as transcriptionist for Abby Potash, PA-C   I have reviewed the above documentation for accuracy and completeness, and I agree with the above. Abby Potash, PA-C\

## 2020-02-25 DIAGNOSIS — M79675 Pain in left toe(s): Secondary | ICD-10-CM | POA: Diagnosis not present

## 2020-02-25 DIAGNOSIS — M0579 Rheumatoid arthritis with rheumatoid factor of multiple sites without organ or systems involvement: Secondary | ICD-10-CM | POA: Diagnosis not present

## 2020-02-27 ENCOUNTER — Other Ambulatory Visit: Payer: BLUE CROSS/BLUE SHIELD

## 2020-03-24 DIAGNOSIS — M0579 Rheumatoid arthritis with rheumatoid factor of multiple sites without organ or systems involvement: Secondary | ICD-10-CM | POA: Diagnosis not present

## 2020-05-12 LAB — HM PAP SMEAR

## 2020-05-19 DIAGNOSIS — M0579 Rheumatoid arthritis with rheumatoid factor of multiple sites without organ or systems involvement: Secondary | ICD-10-CM | POA: Diagnosis not present

## 2020-07-07 ENCOUNTER — Ambulatory Visit (INDEPENDENT_AMBULATORY_CARE_PROVIDER_SITE_OTHER): Payer: BC Managed Care – PPO | Admitting: Physician Assistant

## 2020-07-17 DIAGNOSIS — Z79899 Other long term (current) drug therapy: Secondary | ICD-10-CM | POA: Diagnosis not present

## 2020-07-17 DIAGNOSIS — M0579 Rheumatoid arthritis with rheumatoid factor of multiple sites without organ or systems involvement: Secondary | ICD-10-CM | POA: Diagnosis not present

## 2020-07-17 LAB — CBC AND DIFFERENTIAL
HCT: 40 (ref 36–46)
Hemoglobin: 13.8 (ref 12.0–16.0)
Platelets: 346 (ref 150–399)
WBC: 7.2

## 2020-07-17 LAB — BASIC METABOLIC PANEL
BUN: 10 (ref 4–21)
CO2: 19 (ref 13–22)
Chloride: 103 (ref 99–108)
Creatinine: 0.9 (ref 0.5–1.1)
Glucose: 112
Potassium: 4.4 (ref 3.4–5.3)
Sodium: 139 (ref 137–147)

## 2020-07-17 LAB — COMPREHENSIVE METABOLIC PANEL
Albumin: 4.5 (ref 3.5–5.0)
Calcium: 8.8 (ref 8.7–10.7)
GFR calc Af Amer: 96
GFR calc non Af Amer: 83
Globulin: 2.8

## 2020-07-17 LAB — CBC: RBC: 4.49 (ref 3.87–5.11)

## 2020-07-17 LAB — HEPATIC FUNCTION PANEL
ALT: 15 (ref 7–35)
AST: 19 (ref 13–35)
Alkaline Phosphatase: 97 (ref 25–125)
Bilirubin, Total: 0.3

## 2020-07-28 ENCOUNTER — Ambulatory Visit (INDEPENDENT_AMBULATORY_CARE_PROVIDER_SITE_OTHER): Payer: BC Managed Care – PPO | Admitting: Family Medicine

## 2020-07-28 ENCOUNTER — Other Ambulatory Visit: Payer: Self-pay

## 2020-07-28 ENCOUNTER — Encounter (INDEPENDENT_AMBULATORY_CARE_PROVIDER_SITE_OTHER): Payer: Self-pay | Admitting: Family Medicine

## 2020-07-28 VITALS — BP 131/83 | HR 95 | Temp 98.0°F | Ht 66.0 in | Wt 205.0 lb

## 2020-07-28 DIAGNOSIS — M069 Rheumatoid arthritis, unspecified: Secondary | ICD-10-CM

## 2020-07-28 DIAGNOSIS — E038 Other specified hypothyroidism: Secondary | ICD-10-CM | POA: Diagnosis not present

## 2020-07-28 DIAGNOSIS — E669 Obesity, unspecified: Secondary | ICD-10-CM | POA: Diagnosis not present

## 2020-07-28 DIAGNOSIS — Z6833 Body mass index (BMI) 33.0-33.9, adult: Secondary | ICD-10-CM

## 2020-07-28 DIAGNOSIS — Z9189 Other specified personal risk factors, not elsewhere classified: Secondary | ICD-10-CM

## 2020-07-28 DIAGNOSIS — F419 Anxiety disorder, unspecified: Secondary | ICD-10-CM

## 2020-07-28 NOTE — Progress Notes (Signed)
Chief Complaint:   OBESITY Karess is here to discuss her progress with her obesity treatment plan along with follow-up of her obesity related diagnoses.   Today's visit was #: 20 Starting weight: 213 lbs Starting date: 07/13/2018 Today's weight: 205 lbs Today's date: 07/28/2020 Total lbs lost to date: 8 lbs Body mass index is 33.09 kg/m.  Total weight loss percentage to date: -3.76%  Interim History: Kaicee is followed by Dr. Amil Amen of [facility] for her RA.  She says with the increase of Wellbutrin, she is not hungry.    For breakfast, she has yogurt with 2 tbs of grape nuts and feels full.  She has been prioritizing sleep.    Nutrition Plan: Category 3 Plan for 75% of the time. Activity: Walking 2-3 miles 4-5 times per week.  Assessment/Plan:   1. Rheumatoid arthritis, involving unspecified site, unspecified whether rheumatoid factor present [facility]) She is taking golimumab.  Followed by Rheumatology and has had labs drawn with them.  Plan:  Will request labs.  2. Other specified hypothyroidism Course: Stable. Medication: levothyroxine 150 mcg daily.   Plan: Patient was instructed not to take MVM or iron within 4 hours of taking thyroid medications. We will continue to monitor alongside Endocrinology/PCP as it relates to her weight loss journey.   Lab Results  Component Value Date   TSH 0.477 07/13/2018   3. Anxiety, with emotional eating Allyiah is taking Wellbutrin XL 300 mg daily.  Plan:  Continue Wellbutrin.  Behavior modification techniques were discussed today to help Janelly deal with her anxiety.  Orders and follow up as documented in patient record.   4. At risk for heart disease Due to Addasyn's current state of health and medical condition(s), she is at a higher risk for heart disease.  This puts the patient at much greater risk to subsequently develop cardiopulmonary conditions that can significantly affect patient's quality of life in a  negative manner.    At least 10 minutes were spent on counseling Peggi about these concerns today. Counseling:  Intensive lifestyle modifications were discussed with Tylasia as the most appropriate first line of treatment.  she will continue to work on diet, exercise, and weight loss efforts.  We will continue to reassess these conditions on a fairly regular basis in an attempt to decrease the patient's overall morbidity and mortality.  Evidence-based interventions for health behavior change were utilized today including the discussion of self monitoring techniques, problem-solving barriers, and SMART goal setting techniques.  Specifically, regarding patient's less desirable eating habits and patterns, we employed the technique of small changes when Destynee has not been able to fully commit to her prudent nutritional plan.  5. Class 1 obesity with serious comorbidity and body mass index (BMI) of 33.0 to 33.9 in adult, unspecified obesity type  Course: Zerina is currently in the action stage of change. As such, her goal is to continue with weight loss efforts.   Nutrition goals: She has agreed to the Category 2 Plan +protein snack if hungry.   Exercise goals: For substantial health benefits, adults should do at least 150 minutes (2 hours and 30 minutes) a week of moderate-intensity, or 75 minutes (1 hour and 15 minutes) a week of vigorous-intensity aerobic physical activity, or an equivalent combination of moderate- and vigorous-intensity aerobic activity. Aerobic activity should be performed in episodes of at least 10 minutes, and preferably, it should be spread throughout the week.  Behavioral modification strategies: increasing lean protein intake, decreasing simple  carbohydrates, increasing vegetables and increasing water intake.  Shevonne has agreed to follow-up with our clinic in 2-3 weeks. She was informed of the importance of frequent follow-up visits to maximize her success with intensive  lifestyle modifications for her multiple health conditions.   Objective:   Blood pressure 131/83, pulse 95, temperature 98 F (36.7 C), temperature source Oral, height 5\' 6"  (1.676 m), weight 205 lb (93 kg), SpO2 98 %. Body mass index is 33.09 kg/m.  General: Cooperative, alert, well developed, in no acute distress. HEENT: Conjunctivae and lids unremarkable. Cardiovascular: Regular rhythm.  Lungs: Normal work of breathing. Neurologic: No focal deficits.   Lab Results  Component Value Date   CREATININE 0.77 06/25/2019   BUN 10 06/25/2019   NA 141 06/25/2019   K 4.4 06/25/2019   CL 105 06/25/2019   CO2 25 06/25/2019   Lab Results  Component Value Date   ALT 12 06/25/2019   AST 13 06/25/2019   ALKPHOS 100 06/25/2019   BILITOT 0.3 06/25/2019   Lab Results  Component Value Date   HGBA1C 5.2 06/25/2019   HGBA1C 5.2 07/13/2018   Lab Results  Component Value Date   INSULIN 11.3 06/25/2019   INSULIN 15.4 07/13/2018   Lab Results  Component Value Date   TSH 0.477 07/13/2018   Lab Results  Component Value Date   CHOL 190 06/25/2019   HDL 46 06/25/2019   LDLCALC 133 (H) 06/25/2019   TRIG 57 06/25/2019   Lab Results  Component Value Date   WBC 7.3 07/13/2018   HGB 13.2 07/13/2018   HCT 38.1 07/13/2018   MCV 88 07/13/2018   Attestation Statements:   Reviewed by clinician on day of visit: allergies, medications, problem list, medical history, surgical history, family history, social history, and previous encounter notes.  I, Water quality scientist, CMA, am acting as transcriptionist for Briscoe Deutscher, DO  I have reviewed the above documentation for accuracy and completeness, and I agree with the above. Briscoe Deutscher, DO

## 2020-08-20 ENCOUNTER — Encounter (INDEPENDENT_AMBULATORY_CARE_PROVIDER_SITE_OTHER): Payer: Self-pay | Admitting: Family Medicine

## 2020-08-20 ENCOUNTER — Other Ambulatory Visit: Payer: Self-pay

## 2020-08-20 ENCOUNTER — Ambulatory Visit (INDEPENDENT_AMBULATORY_CARE_PROVIDER_SITE_OTHER): Payer: BC Managed Care – PPO | Admitting: Family Medicine

## 2020-08-20 VITALS — BP 127/82 | HR 94 | Temp 98.5°F | Ht 66.0 in | Wt 204.0 lb

## 2020-08-20 DIAGNOSIS — F5081 Binge eating disorder: Secondary | ICD-10-CM | POA: Diagnosis not present

## 2020-08-20 DIAGNOSIS — Z9189 Other specified personal risk factors, not elsewhere classified: Secondary | ICD-10-CM

## 2020-08-20 DIAGNOSIS — E038 Other specified hypothyroidism: Secondary | ICD-10-CM

## 2020-08-20 DIAGNOSIS — F419 Anxiety disorder, unspecified: Secondary | ICD-10-CM

## 2020-08-20 DIAGNOSIS — M069 Rheumatoid arthritis, unspecified: Secondary | ICD-10-CM | POA: Diagnosis not present

## 2020-08-20 DIAGNOSIS — Z6833 Body mass index (BMI) 33.0-33.9, adult: Secondary | ICD-10-CM

## 2020-08-20 DIAGNOSIS — E669 Obesity, unspecified: Secondary | ICD-10-CM

## 2020-08-20 MED ORDER — LISDEXAMFETAMINE DIMESYLATE 20 MG PO CAPS: 20.0000 mg | ORAL_CAPSULE | Freq: Every day | ORAL | 0 refills | Status: DC

## 2020-08-21 NOTE — Progress Notes (Signed)
Chief Complaint:   OBESITY Briana Brown is here to discuss her progress with her obesity treatment plan along with follow-up of her obesity related diagnoses.   Today's visit was #: 21 Starting weight: 213 lbs Starting date: 07/13/2018 Today's weight: 204 lbs Today's date: 08/20/2020 Total lbs lost to date: 9 lbs Body mass index is 32.93 kg/m.  Total weight loss percentage to date: -4.23%  Interim History:  Briana Brown says that her allergies have worsened over the past week.  She has been taking Sudafed and Benadryl.  She reports getting less sleep.  She says she is working on being stricter with logging her food intake.  Today's bioimpedance results indicate that Briana Brown has gained 2 pounds of water weight since her last visit.  Current Meal Plan: the Category 2 Plan for 75% of the time.  Current Exercise Plan: Cardio / strength training for 35-45 minutes 5-6 times per week.  Assessment/Plan:   1. Other specified hypothyroidism Course: Controlled. Medication: levothyroxine 150 mcg daily.  She is taking her levothyroxine with her Wellbutrin in the morning now.  Advised to take without medications/vitamins/food.  Plan: Patient was instructed not to take MVM or iron within 4 hours of taking thyroid medications.  We will continue to monitor alongside Endocrinology/PCP as it relates to her weight loss journey.   Lab Results  Component Value Date   TSH 0.477 07/13/2018   2. Rheumatoid arthritis, involving unspecified site, unspecified whether rheumatoid factor present [facility]) Briana Brown is followed by Rheumatology.  She gets golimumab infusions every 8 weeks.  She will follow-up next week.  3. Binge eating disorder Briana Brown takes Vyvanse 20 mg daily for BED.    Plan:  Will refill Vyvanse today, as per below.   People who binge eat feel as if they don't have control over how much they eat and have feelings of guilt or self-loathing after a binge eating episode. Briana Brown estimates that  about 30 percent of adults with binge eating disorder also have a history of ADHD. The FDA has approved Vyvanse as a treatment option for both ADHD and binge eating. Vyvanse targets the brain's reward center by increasing the levels of dopamine and norepinephrine, the chemicals of the brain responsible for feelings of pleasure. Mindful eating is the recommended nutritional approach to treating BED.   I have consulted the [place] Controlled Substances Registry for this patient, and feel the risk/benefit ratio today is favorable for proceeding with this prescription for a controlled substance. The patient understands monitoring parameters and red flags.   - Refill lisdexamfetamine (VYVANSE) 20 MG capsule; Take 1 capsule (20 mg total) by mouth daily.  Dispense: 30 capsule; Refill: 0  4. Anxiety, with emotional eating Briana Brown is taking Wellbutrin XL 300 mg daily.  Plan:  Continue Wellbutrin.  Behavior modification techniques were discussed today to help Briana Brown deal with her anxiety.  Orders and follow up as documented in patient record.   5. At risk for heart disease Due to Briana Brown's current state of health and medical condition(s), she is at a higher risk for heart disease.  This puts the patient at much greater risk to subsequently develop cardiopulmonary conditions that can significantly affect patient's quality of life in a negative manner. At least 8 minutes were spent on counseling Briana Brown about these concerns today. Evidence-based interventions for health behavior change were utilized today including the discussion of self monitoring techniques, problem-solving barriers, and SMART goal setting techniques.  Specifically, regarding patient's less desirable eating habits and  patterns, we employed the technique of small changes when Briana Brown has not been able to fully commit to her prudent nutritional plan.  6. Class 1 obesity with serious comorbidity and body mass index (BMI) of 33.0 to 33.9 in adult,  unspecified obesity type  Course: Briana Brown is currently in the action stage of change. As such, her goal is to continue with weight loss efforts.   Nutrition goals: She has agreed to the Category 2 Plan.   Exercise goals: For substantial health benefits, adults should do at least 150 minutes (2 hours and 30 minutes) a week of moderate-intensity, or 75 minutes (1 hour and 15 minutes) a week of vigorous-intensity aerobic physical activity, or an equivalent combination of moderate- and vigorous-intensity aerobic activity. Aerobic activity should be performed in episodes of at least 10 minutes, and preferably, it should be spread throughout the week.  Behavioral modification strategies: increasing lean protein intake, decreasing simple carbohydrates, increasing vegetables, increasing water intake, decreasing liquid calories and keeping a strict food journal.  Briana Brown has agreed to follow-up with our clinic in 3 weeks. She was informed of the importance of frequent follow-up visits to maximize her success with intensive lifestyle modifications for her multiple health conditions.   Objective:   Blood pressure 127/82, pulse 94, temperature 98.5 F (36.9 C), temperature source Oral, height 5\' 6"  (1.676 m), weight 204 lb (92.5 kg), SpO2 98 %. Body mass index is 32.93 kg/m.  General: Cooperative, alert, well developed, in no acute distress. HEENT: Conjunctivae and lids unremarkable. Cardiovascular: Regular rhythm.  Lungs: Normal work of breathing. Neurologic: No focal deficits.   Lab Results  Component Value Date   CREATININE 0.77 06/25/2019   BUN 10 06/25/2019   NA 141 06/25/2019   K 4.4 06/25/2019   CL 105 06/25/2019   CO2 25 06/25/2019   Lab Results  Component Value Date   ALT 12 06/25/2019   AST 13 06/25/2019   ALKPHOS 100 06/25/2019   BILITOT 0.3 06/25/2019   Lab Results  Component Value Date   HGBA1C 5.2 06/25/2019   HGBA1C 5.2 07/13/2018   Lab Results  Component Value Date    INSULIN 11.3 06/25/2019   INSULIN 15.4 07/13/2018   Lab Results  Component Value Date   TSH 0.477 07/13/2018   Lab Results  Component Value Date   CHOL 190 06/25/2019   HDL 46 06/25/2019   LDLCALC 133 (H) 06/25/2019   TRIG 57 06/25/2019   Lab Results  Component Value Date   WBC 7.3 07/13/2018   HGB 13.2 07/13/2018   HCT 38.1 07/13/2018   MCV 88 07/13/2018   Attestation Statements:   Reviewed by clinician on day of visit: allergies, medications, problem list, medical history, surgical history, family history, social history, and previous encounter notes.  I, Water quality scientist, CMA, am acting as transcriptionist for Briscoe Deutscher, DO  I have reviewed the above documentation for accuracy and completeness, and I agree with the above. Briscoe Deutscher, DO

## 2020-08-22 ENCOUNTER — Encounter (INDEPENDENT_AMBULATORY_CARE_PROVIDER_SITE_OTHER): Payer: Self-pay | Admitting: Family Medicine

## 2020-08-22 DIAGNOSIS — F5081 Binge eating disorder: Secondary | ICD-10-CM

## 2020-08-25 NOTE — Telephone Encounter (Signed)
Last OV with Dr Wallace 

## 2020-09-01 ENCOUNTER — Ambulatory Visit (INDEPENDENT_AMBULATORY_CARE_PROVIDER_SITE_OTHER): Payer: BC Managed Care – PPO | Admitting: Family Medicine

## 2020-09-03 ENCOUNTER — Ambulatory Visit (INDEPENDENT_AMBULATORY_CARE_PROVIDER_SITE_OTHER): Payer: BC Managed Care – PPO | Admitting: Family Medicine

## 2020-09-03 ENCOUNTER — Other Ambulatory Visit (INDEPENDENT_AMBULATORY_CARE_PROVIDER_SITE_OTHER): Payer: Self-pay | Admitting: Family Medicine

## 2020-09-18 DIAGNOSIS — M0579 Rheumatoid arthritis with rheumatoid factor of multiple sites without organ or systems involvement: Secondary | ICD-10-CM | POA: Diagnosis not present

## 2020-09-23 MED ORDER — LISDEXAMFETAMINE DIMESYLATE 20 MG PO CAPS: 20.0000 mg | ORAL_CAPSULE | Freq: Every day | ORAL | 0 refills | Status: DC

## 2020-10-06 ENCOUNTER — Other Ambulatory Visit: Payer: Self-pay

## 2020-10-06 ENCOUNTER — Encounter (INDEPENDENT_AMBULATORY_CARE_PROVIDER_SITE_OTHER): Payer: Self-pay | Admitting: Family Medicine

## 2020-10-06 ENCOUNTER — Ambulatory Visit (INDEPENDENT_AMBULATORY_CARE_PROVIDER_SITE_OTHER): Payer: BC Managed Care – PPO | Admitting: Family Medicine

## 2020-10-06 VITALS — BP 115/76 | HR 92 | Temp 98.0°F | Ht 66.0 in | Wt 205.0 lb

## 2020-10-06 DIAGNOSIS — Z9189 Other specified personal risk factors, not elsewhere classified: Secondary | ICD-10-CM | POA: Diagnosis not present

## 2020-10-06 DIAGNOSIS — F5081 Binge eating disorder: Secondary | ICD-10-CM

## 2020-10-06 DIAGNOSIS — E038 Other specified hypothyroidism: Secondary | ICD-10-CM

## 2020-10-06 DIAGNOSIS — J029 Acute pharyngitis, unspecified: Secondary | ICD-10-CM | POA: Diagnosis not present

## 2020-10-06 DIAGNOSIS — E669 Obesity, unspecified: Secondary | ICD-10-CM | POA: Diagnosis not present

## 2020-10-06 DIAGNOSIS — Z6834 Body mass index (BMI) 34.0-34.9, adult: Secondary | ICD-10-CM

## 2020-10-06 NOTE — Progress Notes (Signed)
Chief Complaint:   OBESITY Briana Brown is here to discuss her progress with her obesity treatment plan along with follow-up of her obesity related diagnoses.   Today's visit was #: 22 Starting weight: 213 lbs Starting date: 07/13/2018 Today's weight: 205 lbs Today's date: 10/06/2020 Total lbs lost to date: 8 lbs Body mass index is 33.09 kg/m.  Total weight loss percentage to date: -3.76%  Interim History:  Briana Brown has allergic rhinitis - given Naproxen.  She says she overindulged on food/drink last week at the beach.  Current Meal Plan: the Category 2 Plan for 70% of the time.  Current Exercise Plan: Walking/cardio for 60 minutes 4-5 times per week.  Assessment/Plan:   1. Sore throat Emmory is currently taking Claritin and Flonase for allergic rhinitis.  Plan:  Start Singulair 10 mg daily, prednisone taper, and amoxicillin 875 mg twice daily.  2. Other specified hypothyroidism Course: Stable. Medication: Synthroid 150 mcg daily.   Plan:  Continue Synthroid.  Patient was instructed not to take MVM or iron within 4 hours of taking thyroid medications.  We will continue to monitor alongside Endocrinology/PCP as it relates to her weight loss journey.   Lab Results  Component Value Date   TSH 0.477 07/13/2018   3. Binge eating disorder Briana Brown is taking Vyvanse 20 mg daily for BED.  Plan:  Continue Vyvanse 20 mg daily.  Will refill today.  I have consulted the [place] Controlled Substances Registry for this patient, and feel the risk/benefit ratio today is favorable for proceeding with this prescription for a controlled substance. The patient understands monitoring parameters and red flags.   People who binge eat feel as if they don't have control over how much they eat and have feelings of guilt or self-loathing after a binge eating episode. [place] estimates that about 30 percent of adults with binge eating disorder also have a history of ADHD. The FDA has approved  Vyvanse as a treatment option for both ADHD and binge eating. Vyvanse targets the brain's reward center by increasing the levels of dopamine and norepinephrine, the chemicals of the brain responsible for feelings of pleasure. Mindful eating is the recommended nutritional approach to treating BED.   4. At risk for side effect of medication Due to Briana Brown's current conditions and medications, she is at a higher risk for the drug side effect of insomnia.  At least 9 minutes was spent on counseling her about these concerns today.  We discussed the benefits and potential risks of these medications, and all of patient's concerns were addressed and questions were answered.  she will call us, or their PCP or other specialists who treat their conditions with medications, with any questions or concerns that may develop.    5. Obesity, current BMI 33.1  Course: Briana Brown is currently in the action stage of change. As such, her goal is to continue with weight loss efforts.   Nutrition goals: She has agreed to the Category 2 Plan.   Exercise goals: For substantial health benefits, adults should do at least 150 minutes (2 hours and 30 minutes) a week of moderate-intensity, or 75 minutes (1 hour and 15 minutes) a week of vigorous-intensity aerobic physical activity, or an equivalent combination of moderate- and vigorous-intensity aerobic activity. Aerobic activity should be performed in episodes of at least 10 minutes, and preferably, it should be spread throughout the week.  Behavioral modification strategies: increasing lean protein intake, decreasing simple carbohydrates, increasing vegetables and increasing water intake.  Briana Brown  has agreed to follow-up with our clinic in 3 weeks. She was informed of the importance of frequent follow-up visits to maximize her success with intensive lifestyle modifications for her multiple health conditions.   Objective:   Blood pressure 115/76, pulse 92, temperature 98 F  (36.7 C), temperature source Oral, height 5\' 6"  (1.676 m), weight 205 lb (93 kg), SpO2 96 %. Body mass index is 33.09 kg/m.  General: Cooperative, alert, well developed, in no acute distress. HEENT: Conjunctivae and lids unremarkable. Cardiovascular: Regular rhythm.  Lungs: Normal work of breathing. Neurologic: No focal deficits.   Lab Results  Component Value Date   CREATININE 0.9 07/17/2020   BUN 10 07/17/2020   NA 139 07/17/2020   K 4.4 07/17/2020   CL 103 07/17/2020   CO2 19 07/17/2020   Lab Results  Component Value Date   ALT 15 07/17/2020   AST 19 07/17/2020   ALKPHOS 97 07/17/2020   BILITOT 0.3 06/25/2019   Lab Results  Component Value Date   HGBA1C 5.2 06/25/2019   HGBA1C 5.2 07/13/2018   Lab Results  Component Value Date   INSULIN 11.3 06/25/2019   INSULIN 15.4 07/13/2018   Lab Results  Component Value Date   TSH 0.477 07/13/2018   Lab Results  Component Value Date   CHOL 190 06/25/2019   HDL 46 06/25/2019   LDLCALC 133 (H) 06/25/2019   TRIG 57 06/25/2019   Lab Results  Component Value Date   WBC 7.2 07/17/2020   HGB 13.8 07/17/2020   HCT 40 07/17/2020   MCV 88 07/13/2018   PLT 346 07/17/2020   Attestation Statements:   Reviewed by clinician on day of visit: allergies, medications, problem list, medical history, surgical history, family history, social history, and previous encounter notes.  I, Water quality scientist, CMA, am acting as transcriptionist for Briscoe Deutscher, DO  I have reviewed the above documentation for accuracy and completeness, and I agree with the above. Briscoe Deutscher, DO

## 2020-10-07 ENCOUNTER — Encounter (INDEPENDENT_AMBULATORY_CARE_PROVIDER_SITE_OTHER): Payer: Self-pay | Admitting: Family Medicine

## 2020-10-07 DIAGNOSIS — M79675 Pain in left toe(s): Secondary | ICD-10-CM | POA: Diagnosis not present

## 2020-10-07 DIAGNOSIS — M0579 Rheumatoid arthritis with rheumatoid factor of multiple sites without organ or systems involvement: Secondary | ICD-10-CM | POA: Diagnosis not present

## 2020-10-08 MED ORDER — MONTELUKAST SODIUM 10 MG PO TABS: 10.0000 mg | ORAL_TABLET | Freq: Every day | ORAL | 0 refills | Status: DC

## 2020-10-08 MED ORDER — AMOXICILLIN 875 MG PO TABS: 875.0000 mg | ORAL_TABLET | Freq: Two times a day (BID) | ORAL | 0 refills | Status: AC

## 2020-10-08 MED ORDER — METHYLPREDNISOLONE 4 MG PO TBPK: ORAL_TABLET | ORAL | 0 refills | Status: DC

## 2020-10-11 MED ORDER — LISDEXAMFETAMINE DIMESYLATE 20 MG PO CAPS: 20.0000 mg | ORAL_CAPSULE | Freq: Every day | ORAL | 0 refills | Status: DC

## 2020-10-20 ENCOUNTER — Encounter (INDEPENDENT_AMBULATORY_CARE_PROVIDER_SITE_OTHER): Payer: Self-pay | Admitting: Family Medicine

## 2020-10-20 DIAGNOSIS — F5081 Binge eating disorder: Secondary | ICD-10-CM

## 2020-10-20 DIAGNOSIS — F50819 Binge eating disorder, unspecified: Secondary | ICD-10-CM

## 2020-10-20 NOTE — Telephone Encounter (Signed)
Dr.Wallace °

## 2020-10-21 MED ORDER — LISDEXAMFETAMINE DIMESYLATE 20 MG PO CAPS: 20.0000 mg | ORAL_CAPSULE | Freq: Every day | ORAL | 0 refills | Status: DC

## 2020-11-03 ENCOUNTER — Other Ambulatory Visit (INDEPENDENT_AMBULATORY_CARE_PROVIDER_SITE_OTHER): Payer: Self-pay | Admitting: Family Medicine

## 2020-11-03 DIAGNOSIS — J029 Acute pharyngitis, unspecified: Secondary | ICD-10-CM

## 2020-11-03 NOTE — Telephone Encounter (Signed)
Pt last seen by Dr. Wallace.  

## 2020-11-05 ENCOUNTER — Ambulatory Visit (INDEPENDENT_AMBULATORY_CARE_PROVIDER_SITE_OTHER): Payer: BC Managed Care – PPO | Admitting: Family Medicine

## 2020-11-08 ENCOUNTER — Encounter (INDEPENDENT_AMBULATORY_CARE_PROVIDER_SITE_OTHER): Payer: Self-pay | Admitting: Family Medicine

## 2020-11-11 ENCOUNTER — Other Ambulatory Visit (INDEPENDENT_AMBULATORY_CARE_PROVIDER_SITE_OTHER): Payer: Self-pay | Admitting: Family Medicine

## 2020-11-11 ENCOUNTER — Ambulatory Visit (INDEPENDENT_AMBULATORY_CARE_PROVIDER_SITE_OTHER): Payer: BC Managed Care – PPO | Admitting: Family Medicine

## 2020-11-11 DIAGNOSIS — F5081 Binge eating disorder: Secondary | ICD-10-CM

## 2020-11-11 NOTE — Telephone Encounter (Signed)
Last seen by Dr. Wallace. 

## 2020-11-12 MED ORDER — LISDEXAMFETAMINE DIMESYLATE 20 MG PO CAPS: 20.0000 mg | ORAL_CAPSULE | Freq: Every day | ORAL | 0 refills | Status: DC

## 2020-11-13 ENCOUNTER — Encounter (INDEPENDENT_AMBULATORY_CARE_PROVIDER_SITE_OTHER): Payer: Self-pay | Admitting: Family Medicine

## 2020-11-13 ENCOUNTER — Ambulatory Visit (INDEPENDENT_AMBULATORY_CARE_PROVIDER_SITE_OTHER): Payer: BC Managed Care – PPO | Admitting: Family Medicine

## 2020-11-13 ENCOUNTER — Other Ambulatory Visit: Payer: Self-pay

## 2020-11-13 VITALS — BP 137/87 | HR 104 | Temp 98.2°F | Ht 66.0 in | Wt 204.0 lb

## 2020-11-13 DIAGNOSIS — Z9189 Other specified personal risk factors, not elsewhere classified: Secondary | ICD-10-CM

## 2020-11-13 DIAGNOSIS — M069 Rheumatoid arthritis, unspecified: Secondary | ICD-10-CM

## 2020-11-13 DIAGNOSIS — E669 Obesity, unspecified: Secondary | ICD-10-CM

## 2020-11-13 DIAGNOSIS — M0579 Rheumatoid arthritis with rheumatoid factor of multiple sites without organ or systems involvement: Secondary | ICD-10-CM | POA: Diagnosis not present

## 2020-11-13 DIAGNOSIS — F5081 Binge eating disorder: Secondary | ICD-10-CM | POA: Diagnosis not present

## 2020-11-13 DIAGNOSIS — Z6834 Body mass index (BMI) 34.0-34.9, adult: Secondary | ICD-10-CM

## 2020-11-13 DIAGNOSIS — E038 Other specified hypothyroidism: Secondary | ICD-10-CM | POA: Diagnosis not present

## 2020-11-13 MED ORDER — LISDEXAMFETAMINE DIMESYLATE 30 MG PO CAPS: 30.0000 mg | ORAL_CAPSULE | Freq: Every day | ORAL | 0 refills | Status: DC

## 2020-11-19 NOTE — Progress Notes (Signed)
Chief Complaint:   OBESITY Briana Brown is here to discuss her progress with her obesity treatment plan along with follow-up of her obesity related diagnoses.   Today's visit was #: 23 Starting weight: 213 lbs Starting date: 07/13/2018 Today's weight: 204 lbs Today's date: 11/13/2020 Weight change since last visit: 1 lb Total lbs lost to date: 9 lbs Body mass index is 32.93 kg/m.  Total weight loss percentage to date: -4.23%  Interim History:  Briana Brown had an infusion today.  She endorses increased fatigue.  She says she stopped taking Celebrex 3 days ago.  She is trying Advil for her pain. Current Meal Plan: the Category 2 Plan for 85% of the time.  Current Exercise Plan: Walking for 60 minutes 3-4 times per week.  Assessment/Plan:   Meds ordered this encounter  Medications   lisdexamfetamine (VYVANSE) 30 MG capsule    Sig: Take 1 capsule (30 mg total) by mouth daily.    Dispense:  30 capsule    Refill:  0    1. Other specified hypothyroidism Course: Controlled. Medication: Synthroid 150 mcg daily.   Plan: Patient was instructed not to take MVM or iron within 4 hours of taking thyroid medications.  We will continue to monitor alongside Endocrinology/PCP as it relates to her weight loss journey.   Lab Results  Component Value Date   TSH 0.477 07/13/2018   2. Rheumatoid arthritis, involving unspecified site, unspecified whether rheumatoid factor present ([place]) Briana Brown had an infusion of golimumab today.  She is taking Advil for her pain.  She stopped Celebrex 3 days ago.  3. Binge eating disorder Briana Brown is taking Vyvanse 20 mg daily. Plan:  Increase Vyvanse to 30 mg daily.  People who binge eat feel as if they don't have control over how much they eat and have feelings of guilt or self-loathing after a binge eating episode. Briana Brown estimates that about 30 percent of adults with binge eating disorder also have a history of ADHD. The FDA has approved Vyvanse as a  treatment option for both ADHD and binge eating. Vyvanse targets the brain's reward center by increasing the levels of dopamine and norepinephrine, the chemicals of the brain responsible for feelings of pleasure. Mindful eating is the recommended nutritional approach to treating BED.   I have consulted the [place] Controlled Substances Registry for this patient, and feel the risk/benefit ratio today is favorable for proceeding with this prescription for a controlled substance. The patient understands monitoring parameters and red flags.   - Increase and refill lisdexamfetamine (VYVANSE) 30 MG capsule; Take 1 capsule (30 mg total) by mouth daily.  Dispense: 30 capsule; Refill: 0  4. At risk for heart disease Due to Bryar's current state of health and medical condition(s), she is at a higher risk for heart disease.  This puts the patient at much greater risk to subsequently develop cardiopulmonary conditions that can significantly affect patient's quality of life in a negative manner.    At least 8 minutes were spent on counseling Briana Brown about these concerns today. Evidence-based interventions for health behavior change were utilized today including the discussion of self monitoring techniques, problem-solving barriers, and SMART goal setting techniques.  Specifically, regarding patient's less desirable eating habits and patterns, we employed the technique of small changes when Briana Brown has not been able to fully commit to her prudent nutritional plan.  5. Obesity, current BMI 33  Course: Briana Brown is currently in the action stage of change. As such, her goal is  to continue with weight loss efforts.   Nutrition goals: She has agreed to the Category 2 Plan.   Exercise goals: For substantial health benefits, adults should do at least 150 minutes (2 hours and 30 minutes) a week of moderate-intensity, or 75 minutes (1 hour and 15 minutes) a week of vigorous-intensity aerobic physical activity, or an equivalent  combination of moderate- and vigorous-intensity aerobic activity. Aerobic activity should be performed in episodes of at least 10 minutes, and preferably, it should be spread throughout the week.  Behavioral modification strategies: increasing lean protein intake, decreasing simple carbohydrates and increasing vegetables.  Briana Brown has agreed to follow-up with our clinic in 3 weeks. She was informed of the importance of frequent follow-up visits to maximize her success with intensive lifestyle modifications for her multiple health conditions.   Objective:   Blood pressure 137/87, pulse (!) 104, temperature 98.2 F (36.8 C), temperature source Oral, height 5\' 6"  (1.676 m), weight 204 lb (92.5 kg), SpO2 95 %. Body mass index is 32.93 kg/m.  General: Cooperative, alert, well developed, in no acute distress. HEENT: Conjunctivae and lids unremarkable. Cardiovascular: Regular rhythm.  Lungs: Normal work of breathing. Neurologic: No focal deficits.   Lab Results  Component Value Date   CREATININE 0.9 07/17/2020   BUN 10 07/17/2020   NA 139 07/17/2020   K 4.4 07/17/2020   CL 103 07/17/2020   CO2 19 07/17/2020   Lab Results  Component Value Date   ALT 15 07/17/2020   AST 19 07/17/2020   ALKPHOS 97 07/17/2020   BILITOT 0.3 06/25/2019   Lab Results  Component Value Date   HGBA1C 5.2 06/25/2019   HGBA1C 5.2 07/13/2018   Lab Results  Component Value Date   INSULIN 11.3 06/25/2019   INSULIN 15.4 07/13/2018   Lab Results  Component Value Date   TSH 0.477 07/13/2018   Lab Results  Component Value Date   CHOL 190 06/25/2019   HDL 46 06/25/2019   LDLCALC 133 (H) 06/25/2019   TRIG 57 06/25/2019   Lab Results  Component Value Date   WBC 7.2 07/17/2020   HGB 13.8 07/17/2020   HCT 40 07/17/2020   MCV 88 07/13/2018   PLT 346 07/17/2020   Attestation Statements:   Reviewed by clinician on day of visit: allergies, medications, problem list, medical history, surgical history,  family history, social history, and previous encounter notes.  I, Water quality scientist, CMA, am acting as transcriptionist for Briscoe Deutscher, DO  I have reviewed the above documentation for accuracy and completeness, and I agree with the above. Briscoe Deutscher, DO

## 2020-12-01 ENCOUNTER — Ambulatory Visit (INDEPENDENT_AMBULATORY_CARE_PROVIDER_SITE_OTHER): Payer: BC Managed Care – PPO | Admitting: Adult Health

## 2020-12-04 ENCOUNTER — Ambulatory Visit (INDEPENDENT_AMBULATORY_CARE_PROVIDER_SITE_OTHER): Payer: BC Managed Care – PPO | Admitting: Family Medicine

## 2020-12-16 ENCOUNTER — Other Ambulatory Visit (INDEPENDENT_AMBULATORY_CARE_PROVIDER_SITE_OTHER): Payer: Self-pay | Admitting: Family Medicine

## 2020-12-16 DIAGNOSIS — F5081 Binge eating disorder: Secondary | ICD-10-CM

## 2020-12-16 MED ORDER — LISDEXAMFETAMINE DIMESYLATE 30 MG PO CAPS: 30.0000 mg | ORAL_CAPSULE | Freq: Every day | ORAL | 0 refills | Status: DC

## 2020-12-18 ENCOUNTER — Ambulatory Visit (INDEPENDENT_AMBULATORY_CARE_PROVIDER_SITE_OTHER): Payer: BC Managed Care – PPO | Admitting: Family Medicine

## 2021-01-08 ENCOUNTER — Encounter (INDEPENDENT_AMBULATORY_CARE_PROVIDER_SITE_OTHER): Payer: Self-pay | Admitting: Family Medicine

## 2021-01-08 ENCOUNTER — Ambulatory Visit (INDEPENDENT_AMBULATORY_CARE_PROVIDER_SITE_OTHER): Payer: BC Managed Care – PPO | Admitting: Family Medicine

## 2021-01-08 ENCOUNTER — Other Ambulatory Visit: Payer: Self-pay

## 2021-01-08 VITALS — BP 135/84 | HR 121 | Temp 98.1°F | Ht 66.0 in | Wt 195.0 lb

## 2021-01-08 DIAGNOSIS — M0579 Rheumatoid arthritis with rheumatoid factor of multiple sites without organ or systems involvement: Secondary | ICD-10-CM | POA: Diagnosis not present

## 2021-01-08 DIAGNOSIS — J301 Allergic rhinitis due to pollen: Secondary | ICD-10-CM

## 2021-01-08 DIAGNOSIS — E669 Obesity, unspecified: Secondary | ICD-10-CM

## 2021-01-08 DIAGNOSIS — Z9189 Other specified personal risk factors, not elsewhere classified: Secondary | ICD-10-CM | POA: Diagnosis not present

## 2021-01-08 DIAGNOSIS — M069 Rheumatoid arthritis, unspecified: Secondary | ICD-10-CM | POA: Diagnosis not present

## 2021-01-08 DIAGNOSIS — F5081 Binge eating disorder: Secondary | ICD-10-CM | POA: Diagnosis not present

## 2021-01-08 DIAGNOSIS — Z6834 Body mass index (BMI) 34.0-34.9, adult: Secondary | ICD-10-CM

## 2021-01-08 MED ORDER — MONTELUKAST SODIUM 10 MG PO TABS: 10.0000 mg | ORAL_TABLET | Freq: Every day | ORAL | 0 refills | Status: DC

## 2021-01-08 MED ORDER — LISDEXAMFETAMINE DIMESYLATE 30 MG PO CAPS: 30.0000 mg | ORAL_CAPSULE | Freq: Every day | ORAL | 0 refills | Status: DC

## 2021-01-08 NOTE — Progress Notes (Signed)
Chief Complaint:   OBESITY Briana Brown is here to discuss her progress with her obesity treatment plan along with follow-up of her obesity related diagnoses. See Medical Weight Management Flowsheet for complete bioelectrical impedance results.  Today's visit was #: 24 Starting weight: 213 lbs Starting date: 07/13/2018 Today's weight: 195 lbs Today's date: 01/08/2021 Weight change since last visit: 9 lbs Total lbs lost to date: 18 lbs Body mass index is 31.47 kg/m.  Total weight loss percentage to date: -8.45%  Interim History: Briana Brown endorses fatigue - infusion today.  She went to a conference for 1 week.  She says she is focusing on protein. Nutrition Plan: the Category 2 Plan for 50% of the time. Activity:  As is.  Assessment/Plan:   1. Seasonal allergic rhinitis due to pollen Improved. Briana Brown is taking Singulair 10 mg daily.  Plan:  Will refill Singulair 10 mg daily, as per below.  - Refill montelukast (SINGULAIR) 10 MG tablet; Take 1 tablet (10 mg total) by mouth at bedtime.  Dispense: 30 tablet; Refill: 0  2. Rheumatoid arthritis Amariana is followed by Rheumatology.  She gets golimumab infusions every 8 weeks with infusion scheduled for today.    3. Binge eating disorder Briana Brown is taking Vyvanse 30 mg daily.  Plan:  Refill Vyvanse at current dose.  People who binge eat feel as if they don't have control over how much they eat and have feelings of guilt or self-loathing after a binge eating episode. [name] estimates that about 30 percent of adults with binge eating disorder also have a history of ADHD. The FDA has approved Vyvanse as a treatment option for both ADHD and binge eating. Vyvanse targets the brain's reward center by increasing the levels of dopamine and norepinephrine, the chemicals of the brain responsible for feelings of pleasure. Mindful eating is the recommended nutritional approach to treating BED.   - Refill lisdexamfetamine (VYVANSE) 30 MG  capsule; Take 1 capsule (30 mg total) by mouth daily.  Dispense: 30 capsule; Refill: 0  I have consulted the [place] Controlled Substances Registry for this patient, and feel the risk/benefit ratio today is favorable for proceeding with this prescription for a controlled substance. The patient understands monitoring parameters and red flags.   4. At risk for deficient intake of food Briana Brown was given extensive education and counseling today of more than 8 minutes on risks associated with deficient food intake.  Counseled her on the importance of following our prescribed meal plan and eating adequate amounts of protein.  Discussed with [name] that inadequate food intake over longer periods of time can slow their metabolism down significantly.    5. Obesity, current BMI 31.5  Course: Briana Brown is currently in the action stage of change. As such, her goal is to continue with weight loss efforts.   Nutrition goals: She has agreed to the Category 2 Plan.   Exercise goals:  As is.  Behavioral modification strategies: increasing lean protein intake, decreasing simple carbohydrates, increasing vegetables, increasing water intake, and decreasing liquid calories.  Briana Brown has agreed to follow-up with our clinic in 4 weeks. She was informed of the importance of frequent follow-up visits to maximize her success with intensive lifestyle modifications for her multiple health conditions.   Objective:   Blood pressure 135/84, pulse (!) 121, temperature 98.1 F (36.7 C), height '5\' 6"'$  (1.676 m), weight 195 lb (88.5 kg), SpO2 98 %. Body mass index is 31.47 kg/m.  General: Cooperative, alert, well developed, in no  acute distress. HEENT: Conjunctivae and lids unremarkable. Cardiovascular: Regular rhythm.  Lungs: Normal work of breathing. Neurologic: No focal deficits.   Lab Results  Component Value Date   CREATININE 0.9 07/17/2020   BUN 10 07/17/2020   NA 139 07/17/2020   K 4.4 07/17/2020   CL 103  07/17/2020   CO2 19 07/17/2020   Lab Results  Component Value Date   ALT 15 07/17/2020   AST 19 07/17/2020   ALKPHOS 97 07/17/2020   BILITOT 0.3 06/25/2019   Lab Results  Component Value Date   HGBA1C 5.2 06/25/2019   HGBA1C 5.2 07/13/2018   Lab Results  Component Value Date   INSULIN 11.3 06/25/2019   INSULIN 15.4 07/13/2018   Lab Results  Component Value Date   TSH 0.477 07/13/2018   Lab Results  Component Value Date   CHOL 190 06/25/2019   HDL 46 06/25/2019   LDLCALC 133 (H) 06/25/2019   TRIG 57 06/25/2019   Lab Results  Component Value Date   VD25OH 37.5 06/25/2019   VD25OH 56.7 07/13/2018   Lab Results  Component Value Date   WBC 7.2 07/17/2020   HGB 13.8 07/17/2020   HCT 40 07/17/2020   MCV 88 07/13/2018   PLT 346 07/17/2020   Attestation Statements:   Reviewed by clinician on day of visit: allergies, medications, problem list, medical history, surgical history, family history, social history, and previous encounter notes.  I, Water quality scientist, CMA, am acting as transcriptionist for Briscoe Deutscher, DO  I have reviewed the above documentation for accuracy and completeness, and I agree with the above. Briscoe Deutscher, DO

## 2021-01-18 ENCOUNTER — Other Ambulatory Visit (INDEPENDENT_AMBULATORY_CARE_PROVIDER_SITE_OTHER): Payer: Self-pay | Admitting: Family Medicine

## 2021-01-18 DIAGNOSIS — F5081 Binge eating disorder: Secondary | ICD-10-CM

## 2021-01-19 NOTE — Telephone Encounter (Signed)
Dr.Wallace °

## 2021-01-21 NOTE — Telephone Encounter (Signed)
Dr.Wallace °

## 2021-01-29 ENCOUNTER — Other Ambulatory Visit: Payer: Self-pay

## 2021-01-29 ENCOUNTER — Encounter (INDEPENDENT_AMBULATORY_CARE_PROVIDER_SITE_OTHER): Payer: Self-pay | Admitting: Family Medicine

## 2021-01-29 ENCOUNTER — Ambulatory Visit (INDEPENDENT_AMBULATORY_CARE_PROVIDER_SITE_OTHER): Payer: BC Managed Care – PPO | Admitting: Family Medicine

## 2021-01-29 VITALS — BP 123/80 | HR 85 | Temp 98.2°F | Ht 66.0 in | Wt 195.0 lb

## 2021-01-29 DIAGNOSIS — E669 Obesity, unspecified: Secondary | ICD-10-CM | POA: Diagnosis not present

## 2021-01-29 DIAGNOSIS — F419 Anxiety disorder, unspecified: Secondary | ICD-10-CM

## 2021-01-29 DIAGNOSIS — J301 Allergic rhinitis due to pollen: Secondary | ICD-10-CM | POA: Diagnosis not present

## 2021-01-29 DIAGNOSIS — Z6834 Body mass index (BMI) 34.0-34.9, adult: Secondary | ICD-10-CM

## 2021-01-29 DIAGNOSIS — F5081 Binge eating disorder: Secondary | ICD-10-CM | POA: Diagnosis not present

## 2021-01-29 MED ORDER — LISDEXAMFETAMINE DIMESYLATE 30 MG PO CAPS: 30.0000 mg | ORAL_CAPSULE | Freq: Every day | ORAL | 0 refills | Status: DC

## 2021-01-29 MED ORDER — MONTELUKAST SODIUM 10 MG PO TABS: 10.0000 mg | ORAL_TABLET | Freq: Every day | ORAL | 0 refills | Status: DC

## 2021-02-02 NOTE — Progress Notes (Signed)
Chief Complaint:   OBESITY Briana Brown is here to discuss her progress with her obesity treatment plan along with follow-up of her obesity related diagnoses.   Today's visit was #: 25 Starting weight: 213 lbs Starting date: 07/13/2018 Today's weight: 195 lbs Today's date: 01/29/2021 Weight change since last visit: 0 Total lbs lost to date: 18 lbs Body mass index is 31.47 kg/m.  Total weight loss percentage to date: -8.45%  Current Meal Plan: the Category 2 Plan for 90% of the time.  Current Exercise Plan: Walking for 60 minutes 4 times per week.  Interim History:  Briana Brown says she has not been drinking enough water.  She may have the opportunity (through grants) to take a vacation with her husband to relax and reconnect.  Assessment/Plan:   1. Seasonal allergic rhinitis due to pollen Improved. Briana Brown is taking Singulair 10 mg daily.   Plan:  Will refill Singulair 10 mg daily, as per below.  - Refill montelukast (SINGULAIR) 10 MG tablet; Take 1 tablet (10 mg total) by mouth at bedtime.  Dispense: 30 tablet; Refill: 0  2. Binge eating disorder Briana Brown is taking Vyvanse 30 mg daily.  The current medical regimen is effective;  continue present plan and medications.  I have consulted the  Controlled Substances Registry for this patient, and feel the risk/benefit ratio today is favorable for proceeding with this prescription for a controlled substance. The patient understands monitoring parameters and red flags.   - Refill lisdexamfetamine (VYVANSE) 30 MG capsule; Take 1 capsule (30 mg total) by mouth daily.  Dispense: 30 capsule; Refill: 0  3. Anxiety, with emotional eating Briana Brown is taking Wellbutrin XL 300 mg daily.   Plan:  Continue Wellbutrin.  Behavior modification techniques were discussed today to help Briana Brown deal with her anxiety.     4. Obesity, current BMI 31.5  Course: Briana Brown is currently in the action stage of change. As such, her goal is to continue with  weight loss efforts.   Nutrition goals: She has agreed to the Category 2 Plan.   Exercise goals:  As is.  Behavioral modification strategies: increasing lean protein intake, decreasing simple carbohydrates, increasing vegetables, and increasing water intake.  Briana Brown has agreed to follow-up with our clinic in 4 weeks. She was informed of the importance of frequent follow-up visits to maximize her success with intensive lifestyle modifications for her multiple health conditions.   Objective:   Blood pressure 123/80, pulse 85, temperature 98.2 F (36.8 C), temperature source Oral, height '5\' 6"'$  (1.676 m), weight 195 lb (88.5 kg), SpO2 98 %. Body mass index is 31.47 kg/m.  General: Cooperative, alert, well developed, in no acute distress. HEENT: Conjunctivae and lids unremarkable. Cardiovascular: Regular rhythm.  Lungs: Normal work of breathing. Neurologic: No focal deficits.   Lab Results  Component Value Date   CREATININE 0.9 07/17/2020   BUN 10 07/17/2020   NA 139 07/17/2020   K 4.4 07/17/2020   CL 103 07/17/2020   CO2 19 07/17/2020   Lab Results  Component Value Date   ALT 15 07/17/2020   AST 19 07/17/2020   ALKPHOS 97 07/17/2020   BILITOT 0.3 06/25/2019   Lab Results  Component Value Date   HGBA1C 5.2 06/25/2019   HGBA1C 5.2 07/13/2018   Lab Results  Component Value Date   INSULIN 11.3 06/25/2019   INSULIN 15.4 07/13/2018   Lab Results  Component Value Date   TSH 0.477 07/13/2018   Lab Results  Component Value Date  CHOL 190 06/25/2019   HDL 46 06/25/2019   LDLCALC 133 (H) 06/25/2019   TRIG 57 06/25/2019   Lab Results  Component Value Date   VD25OH 37.5 06/25/2019   VD25OH 56.7 07/13/2018   Lab Results  Component Value Date   WBC 7.2 07/17/2020   HGB 13.8 07/17/2020   HCT 40 07/17/2020   MCV 88 07/13/2018   PLT 346 07/17/2020   Attestation Statements:   Reviewed by clinician on day of visit: allergies, medications, problem list, medical  history, surgical history, family history, social history, and previous encounter notes.  I, Water quality scientist, CMA, am acting as transcriptionist for Briscoe Deutscher, DO  I have reviewed the above documentation for accuracy and completeness, and I agree with the above. Briscoe Deutscher, DO

## 2021-02-09 ENCOUNTER — Encounter (INDEPENDENT_AMBULATORY_CARE_PROVIDER_SITE_OTHER): Payer: Self-pay | Admitting: Family Medicine

## 2021-02-09 NOTE — Telephone Encounter (Signed)
Pt last seen by Dr. Wallace.  

## 2021-02-16 ENCOUNTER — Other Ambulatory Visit (INDEPENDENT_AMBULATORY_CARE_PROVIDER_SITE_OTHER): Payer: Self-pay | Admitting: Family Medicine

## 2021-02-16 DIAGNOSIS — F5081 Binge eating disorder: Secondary | ICD-10-CM

## 2021-02-16 DIAGNOSIS — J301 Allergic rhinitis due to pollen: Secondary | ICD-10-CM

## 2021-02-17 NOTE — Telephone Encounter (Signed)
Pt last seen by Dr. Wallace.  

## 2021-02-26 ENCOUNTER — Other Ambulatory Visit: Payer: Self-pay

## 2021-02-26 ENCOUNTER — Encounter (INDEPENDENT_AMBULATORY_CARE_PROVIDER_SITE_OTHER): Payer: Self-pay | Admitting: Family Medicine

## 2021-02-26 ENCOUNTER — Ambulatory Visit (INDEPENDENT_AMBULATORY_CARE_PROVIDER_SITE_OTHER): Payer: BC Managed Care – PPO | Admitting: Family Medicine

## 2021-02-26 VITALS — BP 133/96 | HR 102 | Temp 98.3°F | Ht 66.0 in | Wt 197.0 lb

## 2021-02-26 DIAGNOSIS — Z6834 Body mass index (BMI) 34.0-34.9, adult: Secondary | ICD-10-CM

## 2021-02-26 DIAGNOSIS — F419 Anxiety disorder, unspecified: Secondary | ICD-10-CM | POA: Diagnosis not present

## 2021-02-26 DIAGNOSIS — J301 Allergic rhinitis due to pollen: Secondary | ICD-10-CM | POA: Diagnosis not present

## 2021-02-26 DIAGNOSIS — E669 Obesity, unspecified: Secondary | ICD-10-CM | POA: Diagnosis not present

## 2021-02-26 DIAGNOSIS — F5081 Binge eating disorder: Secondary | ICD-10-CM | POA: Diagnosis not present

## 2021-02-26 DIAGNOSIS — Z9189 Other specified personal risk factors, not elsewhere classified: Secondary | ICD-10-CM

## 2021-02-26 MED ORDER — MONTELUKAST SODIUM 10 MG PO TABS: 10.0000 mg | ORAL_TABLET | Freq: Every day | ORAL | 3 refills | Status: DC

## 2021-02-26 MED ORDER — LISDEXAMFETAMINE DIMESYLATE 30 MG PO CAPS: 30.0000 mg | ORAL_CAPSULE | Freq: Every day | ORAL | 0 refills | Status: DC

## 2021-02-26 MED ORDER — VYVANSE 60 MG PO CHEW
60.0000 mg | CHEWABLE_TABLET | Freq: Every morning | ORAL | 0 refills | Status: DC
Start: 2021-02-26 — End: 2021-04-27

## 2021-02-26 MED ORDER — MONTELUKAST SODIUM 10 MG PO TABS: 10.0000 mg | ORAL_TABLET | Freq: Every day | ORAL | 0 refills | Status: DC

## 2021-02-27 NOTE — Progress Notes (Signed)
Chief Complaint:   OBESITY Briana Brown is here to discuss her progress with her obesity treatment plan along with follow-up of her obesity related diagnoses.   Today's visit was #: 83 Starting weight: 213 lbs Starting date: 07/13/2018 Today's weight: 197 lbs Today's date: 02/26/2021 Weight change since last visit: +2 lbs Total lbs lost to date: 16 lbs Body mass index is 31.8 kg/m.  Total weight loss percentage to date: -7.51%  Current Meal Plan: the Category 2 Plan for 75% of the time.  Current Exercise Plan: Walking/strength training for 45-60 minutes 3-4 times per week.  Interim History:  Briana Brown says she is happy with her progress.  Assessment/Plan:   1. Seasonal allergic rhinitis due to pollen [name] takes Singulair 10 mg daily and Claritin 10 mg daily.  Plan:  Refill Singulair, as per below.  - Refill montelukast (SINGULAIR) 10 MG tablet; Take 1 tablet (10 mg total) by mouth at bedtime.  Dispense: 90 tablet; Refill: 3  2. Binge eating disorder Briana Brown is taking Vyvanse 30 mg daily for BED.  Plan:  Increase Vyvanse to 60 mg daily.  I have consulted the [place] Controlled Substances Registry for this patient, and feel the risk/benefit ratio today is favorable for proceeding with this prescription for a controlled substance. The patient understands monitoring parameters and red flags.   - Increase and refill Lisdexamfetamine Dimesylate (VYVANSE) 60 MG CHEW; Chew 60 mg by mouth every morning.  Dispense: 30 tablet; Refill: 0  3. Anxiety, with emotional eating Briana Brown is taking Wellbutrin XL 300 mg daily.   Plan:  Continue Wellbutrin.  Behavior modification techniques were discussed today to help Briana Brown deal with her anxiety.     4. At risk for deficient intake of food Briana Brown was given extensive education and counseling today of more than 8 minutes on risks associated with deficient food intake.  Counseled her on the importance of following our prescribed meal plan and  eating adequate amounts of protein.    5. Obesity, current BMI 31.8  Course: Briana Brown is currently in the action stage of change. As such, her goal is to continue with weight loss efforts.   Nutrition goals: She has agreed to the Category 2 Plan.   Exercise goals:  As is.  Behavioral modification strategies: increasing lean protein intake, decreasing simple carbohydrates, increasing vegetables, increasing water intake, and decreasing liquid calories.  [name] has agreed to follow-up with our clinic in 4 weeks. She was informed of the importance of frequent follow-up visits to maximize her success with intensive lifestyle modifications for her multiple health conditions.   Objective:   Blood pressure (!) 133/96, pulse (!) 102, temperature 98.3 F (36.8 C), temperature source Oral, height '5\' 6"'$  (1.676 m), weight 197 lb (89.4 kg), SpO2 97 %. Body mass index is 31.8 kg/m.  General: Cooperative, alert, well developed, in no acute distress. HEENT: Conjunctivae and lids unremarkable. Cardiovascular: Regular rhythm.  Lungs: Normal work of breathing. Neurologic: No focal deficits.   Lab Results  Component Value Date   CREATININE 0.9 07/17/2020   BUN 10 07/17/2020   NA 139 07/17/2020   K 4.4 07/17/2020   CL 103 07/17/2020   CO2 19 07/17/2020   Lab Results  Component Value Date   ALT 15 07/17/2020   AST 19 07/17/2020   ALKPHOS 97 07/17/2020   BILITOT 0.3 06/25/2019   Lab Results  Component Value Date   HGBA1C 5.2 06/25/2019   HGBA1C 5.2 07/13/2018   Lab Results  Component  Value Date   INSULIN 11.3 06/25/2019   INSULIN 15.4 07/13/2018   Lab Results  Component Value Date   TSH 0.477 07/13/2018   Lab Results  Component Value Date   CHOL 190 06/25/2019   HDL 46 06/25/2019   LDLCALC 133 (H) 06/25/2019   TRIG 57 06/25/2019   Lab Results  Component Value Date   VD25OH 37.5 06/25/2019   VD25OH 56.7 07/13/2018   Lab Results  Component Value Date   WBC 7.2 07/17/2020    HGB 13.8 07/17/2020   HCT 40 07/17/2020   MCV 88 07/13/2018   PLT 346 07/17/2020   Attestation Statements:   Reviewed by clinician on day of visit: allergies, medications, problem list, medical history, surgical history, family history, social history, and previous encounter notes.  I, Water quality scientist, CMA, am acting as transcriptionist for Briscoe Deutscher, DO  I have reviewed the above documentation for accuracy and completeness, and I agree with the above. Briscoe Deutscher DO

## 2021-03-05 DIAGNOSIS — Z111 Encounter for screening for respiratory tuberculosis: Secondary | ICD-10-CM | POA: Diagnosis not present

## 2021-03-05 DIAGNOSIS — Z79899 Other long term (current) drug therapy: Secondary | ICD-10-CM | POA: Diagnosis not present

## 2021-03-05 DIAGNOSIS — R5383 Other fatigue: Secondary | ICD-10-CM | POA: Diagnosis not present

## 2021-03-05 DIAGNOSIS — M0579 Rheumatoid arthritis with rheumatoid factor of multiple sites without organ or systems involvement: Secondary | ICD-10-CM | POA: Diagnosis not present

## 2021-03-10 ENCOUNTER — Encounter (INDEPENDENT_AMBULATORY_CARE_PROVIDER_SITE_OTHER): Payer: Self-pay

## 2021-03-10 ENCOUNTER — Encounter (INDEPENDENT_AMBULATORY_CARE_PROVIDER_SITE_OTHER): Payer: Self-pay | Admitting: Family Medicine

## 2021-03-10 NOTE — Telephone Encounter (Signed)
See other mychart message addressing approval

## 2021-03-26 ENCOUNTER — Ambulatory Visit (INDEPENDENT_AMBULATORY_CARE_PROVIDER_SITE_OTHER): Payer: BC Managed Care – PPO | Admitting: Family Medicine

## 2021-03-26 ENCOUNTER — Encounter (INDEPENDENT_AMBULATORY_CARE_PROVIDER_SITE_OTHER): Payer: Self-pay | Admitting: Family Medicine

## 2021-03-26 ENCOUNTER — Other Ambulatory Visit: Payer: Self-pay

## 2021-03-26 VITALS — BP 123/81 | HR 99 | Temp 98.1°F | Ht 66.0 in | Wt 193.0 lb

## 2021-03-26 DIAGNOSIS — M24551 Contracture, right hip: Secondary | ICD-10-CM

## 2021-03-26 DIAGNOSIS — E669 Obesity, unspecified: Secondary | ICD-10-CM | POA: Diagnosis not present

## 2021-03-26 DIAGNOSIS — F419 Anxiety disorder, unspecified: Secondary | ICD-10-CM | POA: Diagnosis not present

## 2021-03-26 DIAGNOSIS — Z6834 Body mass index (BMI) 34.0-34.9, adult: Secondary | ICD-10-CM

## 2021-03-26 DIAGNOSIS — M069 Rheumatoid arthritis, unspecified: Secondary | ICD-10-CM | POA: Diagnosis not present

## 2021-03-31 NOTE — Progress Notes (Signed)
Chief Complaint:   OBESITY Briana Brown is here to discuss her progress with her obesity treatment plan along with follow-up of her obesity related diagnoses. See Medical Weight Management Flowsheet for complete bioelectrical impedance results.  Today's visit was #: 25 Starting weight: 213 lbs Starting date: 07/13/2018 Weight change since last visit: 4 lbs Total lbs lost to date: 20 lbs Total weight loss percentage to date: -9.39%  Nutrition Plan:  Category 2 Plan for 80% of the time. Activity: Walking/strength training for 20-45 minutes 4-5 times per week.  Interim History: Briana Brown says that she is tolerating her medications with no side effects.  She is complaining of some right hip pain.  Assessment/Plan:   1. Rheumatoid arthritis Briana Brown is followed by Rheumatology.  She gets golimumab infusions every 8 weeks with infusion scheduled for today.  2. Right hip flexor tightness We discussed psoas stretches.   3. Anxiety, with emotional eating Briana Brown is taking Wellbutrin XL 150 mg daily.   Plan:  Continue Wellbutrin.  Behavior modification techniques were discussed today to help Briana Brown deal with her anxiety.   4. Obesity, current BMI 31.2  Course: Briana Brown is currently in the action stage of change. As such, her goal is to continue with weight loss efforts.   Nutrition goals: She has agreed to the Category 2 Plan.   Exercise goals:  As is.  Behavioral modification strategies: increasing lean protein intake, decreasing simple carbohydrates, increasing vegetables, and increasing water intake.  Briana Brown has agreed to follow-up with our clinic in 4 weeks. She was informed of the importance of frequent follow-up visits to maximize her success with intensive lifestyle modifications for her multiple health conditions.   Objective:   Blood pressure 123/81, pulse 99, temperature 98.1 F (36.7 C), temperature source Oral, height 5\' 6"  (1.676 m), weight 193 lb (87.5 kg), SpO2 97  %. Body mass index is 31.15 kg/m.  General: Cooperative, alert, well developed, in no acute distress. HEENT: Conjunctivae and lids unremarkable. Cardiovascular: Regular rhythm.  Lungs: Normal work of breathing. Neurologic: No focal deficits.   Lab Results  Component Value Date   CREATININE 0.9 07/17/2020   BUN 10 07/17/2020   NA 139 07/17/2020   K 4.4 07/17/2020   CL 103 07/17/2020   CO2 19 07/17/2020   Lab Results  Component Value Date   ALT 15 07/17/2020   AST 19 07/17/2020   ALKPHOS 97 07/17/2020   BILITOT 0.3 06/25/2019   Lab Results  Component Value Date   HGBA1C 5.2 06/25/2019   HGBA1C 5.2 07/13/2018   Lab Results  Component Value Date   INSULIN 11.3 06/25/2019   INSULIN 15.4 07/13/2018   Lab Results  Component Value Date   TSH 0.477 07/13/2018   Lab Results  Component Value Date   CHOL 190 06/25/2019   HDL 46 06/25/2019   LDLCALC 133 (H) 06/25/2019   TRIG 57 06/25/2019   Lab Results  Component Value Date   VD25OH 37.5 06/25/2019   VD25OH 56.7 07/13/2018   Lab Results  Component Value Date   WBC 7.2 07/17/2020   HGB 13.8 07/17/2020   HCT 40 07/17/2020   MCV 88 07/13/2018   PLT 346 07/17/2020   Attestation Statements:   Reviewed by clinician on day of visit: allergies, medications, problem list, medical history, surgical history, family history, social history, and previous encounter notes.  Time spent on visit including pre-visit chart review and post-visit care and charting was 46 minutes.   I, Water quality scientist,  CMA, am acting as transcriptionist for Briscoe Deutscher, DO  I have reviewed the above documentation for accuracy and completeness, and I agree with the above. -  Briscoe Deutscher, DO, MS, FAAFP, DABOM - Family and Bariatric Medicine.

## 2021-04-08 DIAGNOSIS — M0579 Rheumatoid arthritis with rheumatoid factor of multiple sites without organ or systems involvement: Secondary | ICD-10-CM | POA: Diagnosis not present

## 2021-04-08 DIAGNOSIS — M79675 Pain in left toe(s): Secondary | ICD-10-CM | POA: Diagnosis not present

## 2021-04-15 DIAGNOSIS — F4321 Adjustment disorder with depressed mood: Secondary | ICD-10-CM | POA: Diagnosis not present

## 2021-04-27 ENCOUNTER — Other Ambulatory Visit: Payer: Self-pay

## 2021-04-27 ENCOUNTER — Ambulatory Visit (INDEPENDENT_AMBULATORY_CARE_PROVIDER_SITE_OTHER): Payer: BC Managed Care – PPO | Admitting: Family Medicine

## 2021-04-27 ENCOUNTER — Encounter (INDEPENDENT_AMBULATORY_CARE_PROVIDER_SITE_OTHER): Payer: Self-pay | Admitting: Family Medicine

## 2021-04-27 VITALS — BP 117/82 | HR 96 | Temp 98.0°F | Ht 66.0 in | Wt 194.0 lb

## 2021-04-27 DIAGNOSIS — Z6834 Body mass index (BMI) 34.0-34.9, adult: Secondary | ICD-10-CM

## 2021-04-27 DIAGNOSIS — E669 Obesity, unspecified: Secondary | ICD-10-CM | POA: Diagnosis not present

## 2021-04-27 DIAGNOSIS — M069 Rheumatoid arthritis, unspecified: Secondary | ICD-10-CM

## 2021-04-27 DIAGNOSIS — F5081 Binge eating disorder: Secondary | ICD-10-CM | POA: Diagnosis not present

## 2021-04-27 MED ORDER — LISDEXAMFETAMINE DIMESYLATE 30 MG PO CAPS: 30.0000 mg | ORAL_CAPSULE | Freq: Every day | ORAL | 0 refills | Status: DC

## 2021-04-28 NOTE — Progress Notes (Signed)
Chief Complaint:   OBESITY Briana Brown is here to discuss her progress with her obesity treatment plan along with follow-up of her obesity related diagnoses. See Medical Weight Management Flowsheet for complete bioelectrical impedance results.  Today's visit was #: 28 Starting weight: 213 lbs Starting date: 07/13/2018 Weight change since last visit: +1 lb Total lbs lost to date: 19 lbs Total weight loss percentage to date: -8.92%  Nutrition Plan: Category 2 Meal Plan for 50% of the time.  Activity: Walking/strength training for 20-30+ minutes 7 times per week.   Interim History: Briana Brown is just back from [place] [employer].  She took low dose prednisone during that time for joint pain.  She has an infusion scheduled this week.  Assessment/Plan:   1. Rheumatoid arthritis She is scheduled for an infusion this week. We will continue to monitor symptoms as they relate to her weight loss journey.  2. Binge eating disorder Briana Brown is taking Vyvanse 30 mg daily for BED.  She will continue this dose.  Refill sent to pharmacy today.   The goals for treatment of BED are to reduce eating binges and to achieve healthy eating habits. Because binge eating can correlate with negative emotions, treatment may also address any other mental-health issues, such as depression.  People who binge eat feel as if they don't have control over how much they eat and have feelings of guilt or self-loathing after a binge eating episode.  The FDA has approved Vyvanse as a treatment option for binge eating disorder. Vyvanse targets the brain's reward center by increasing the levels of dopamine and norepinephrine, the chemicals of the brain responsible for feelings of pleasure.  Mindful eating is the recommended nutritional approach to treating BED.   - Refill lisdexamfetamine (VYVANSE) 30 MG capsule; Take 1 capsule (30 mg total) by mouth daily.  Dispense: 30 capsule; Refill: 0  I have  consulted the [place] Controlled Substances Registry for this patient, and feel the risk/benefit ratio today is favorable for proceeding with this prescription for a controlled substance. The patient understands monitoring parameters and red flags.   3. Obesity, current BMI 31.3  Course: Briana Brown is currently in the action stage of change. As such, her goal is to continue with weight loss efforts.   Nutrition goals: She has agreed to the Category 2 Plan.   Exercise goals:  As is.  Behavioral modification strategies: increasing lean protein intake, decreasing simple carbohydrates, increasing vegetables, and increasing water intake.  Briana Brown has agreed to follow-up with our clinic in 4 weeks. She was informed of the importance of frequent follow-up visits to maximize her success with intensive lifestyle modifications for her multiple health conditions.   Objective:   Blood pressure 117/82, pulse 96, temperature 98 F (36.7 C), temperature source Oral, height 5\' 6"  (1.676 m), weight 194 lb (88 kg), SpO2 98 %. Body mass index is 31.31 kg/m.  General: Cooperative, alert, well developed, in no acute distress. HEENT: Conjunctivae and lids unremarkable. Cardiovascular: Regular rhythm.  Lungs: Normal work of breathing. Neurologic: No focal deficits.   Lab Results  Component Value Date   CREATININE 0.9 07/17/2020   BUN 10 07/17/2020   NA 139 07/17/2020   K 4.4 07/17/2020   CL 103 07/17/2020   CO2 19 07/17/2020   Lab Results  Component Value Date   ALT 15 07/17/2020   AST 19 07/17/2020   ALKPHOS 97 07/17/2020   BILITOT 0.3 06/25/2019   Lab Results  Component  Value Date   HGBA1C 5.2 06/25/2019   HGBA1C 5.2 07/13/2018   Lab Results  Component Value Date   INSULIN 11.3 06/25/2019   INSULIN 15.4 07/13/2018   Lab Results  Component Value Date   TSH 0.477 07/13/2018   Lab Results  Component Value Date   CHOL 190 06/25/2019   HDL 46 06/25/2019   LDLCALC 133 (H) 06/25/2019   TRIG  57 06/25/2019   Lab Results  Component Value Date   VD25OH 37.5 06/25/2019   VD25OH 56.7 07/13/2018   Lab Results  Component Value Date   WBC 7.2 07/17/2020   HGB 13.8 07/17/2020   HCT 40 07/17/2020   MCV 88 07/13/2018   PLT 346 07/17/2020   Attestation Statements:   Reviewed by clinician on day of visit: allergies, medications, problem list, medical history, surgical history, family history, social history, and previous encounter notes.  I, Water quality scientist, CMA, am acting as transcriptionist for Briscoe Deutscher, DO  I have reviewed the above documentation for accuracy and completeness, and I agree with the above. -  Briscoe Deutscher, DO, MS, FAAFP, DABOM - Family and Bariatric Medicine.

## 2021-04-29 DIAGNOSIS — F4321 Adjustment disorder with depressed mood: Secondary | ICD-10-CM | POA: Diagnosis not present

## 2021-04-30 DIAGNOSIS — M0579 Rheumatoid arthritis with rheumatoid factor of multiple sites without organ or systems involvement: Secondary | ICD-10-CM | POA: Diagnosis not present

## 2021-05-12 DIAGNOSIS — F4321 Adjustment disorder with depressed mood: Secondary | ICD-10-CM | POA: Diagnosis not present

## 2021-05-21 DIAGNOSIS — H43393 Other vitreous opacities, bilateral: Secondary | ICD-10-CM | POA: Diagnosis not present

## 2021-05-21 DIAGNOSIS — H04123 Dry eye syndrome of bilateral lacrimal glands: Secondary | ICD-10-CM | POA: Diagnosis not present

## 2021-05-25 ENCOUNTER — Encounter (INDEPENDENT_AMBULATORY_CARE_PROVIDER_SITE_OTHER): Payer: Self-pay | Admitting: Family Medicine

## 2021-05-25 ENCOUNTER — Ambulatory Visit (INDEPENDENT_AMBULATORY_CARE_PROVIDER_SITE_OTHER): Payer: BC Managed Care – PPO | Admitting: Family Medicine

## 2021-05-25 ENCOUNTER — Other Ambulatory Visit: Payer: Self-pay

## 2021-05-25 VITALS — BP 114/73 | HR 96 | Temp 97.9°F | Ht 66.0 in | Wt 196.0 lb

## 2021-05-25 DIAGNOSIS — F5081 Binge eating disorder: Secondary | ICD-10-CM

## 2021-05-25 DIAGNOSIS — E88819 Insulin resistance, unspecified: Secondary | ICD-10-CM

## 2021-05-25 DIAGNOSIS — E8881 Metabolic syndrome: Secondary | ICD-10-CM

## 2021-05-25 DIAGNOSIS — E669 Obesity, unspecified: Secondary | ICD-10-CM

## 2021-05-25 DIAGNOSIS — F419 Anxiety disorder, unspecified: Secondary | ICD-10-CM

## 2021-05-25 DIAGNOSIS — Z6834 Body mass index (BMI) 34.0-34.9, adult: Secondary | ICD-10-CM

## 2021-05-25 MED ORDER — LISDEXAMFETAMINE DIMESYLATE 30 MG PO CAPS: 30.0000 mg | ORAL_CAPSULE | Freq: Every day | ORAL | 0 refills | Status: DC

## 2021-05-25 MED ORDER — LISDEXAMFETAMINE DIMESYLATE 30 MG PO CAPS
30.0000 mg | ORAL_CAPSULE | Freq: Every day | ORAL | 0 refills | Status: DC
Start: 2021-05-25 — End: 2021-05-25

## 2021-05-25 NOTE — Progress Notes (Signed)
Chief Complaint:   OBESITY Briana Brown is here to discuss her progress with her obesity treatment plan along with follow-up of her obesity related diagnoses. See Medical Weight Management Flowsheet for complete bioelectrical impedance results.  Today's visit was #: 60 Starting weight: 213 lbs Starting date: 07/13/2018 Weight change since last visit: +2 lbs Total lbs lost to date: 17 lbs Total weight loss percentage to date: -7.98%  Nutrition Plan: Category 2 Plan for 75% of the time.  Activity: Walking/strength training for 60+ minutes 7 times per week.  Interim History: Briana Brown says, "it's my coffee season - creme brule latte".  She reports that she has been closing more rings everyday since November 1.  She will consider GLP-1 next year.  Assessment/Plan:   1. Insulin resistance Not at goal. Goal is HgbA1c < 5.7, fasting insulin closer to 5.  Medication: None.    Plan:  She will continue to focus on protein-rich, low simple carbohydrate foods. We reviewed the importance of hydration, regular exercise for stress reduction, and restorative sleep.   Lab Results  Component Value Date   HGBA1C 5.2 06/25/2019   Lab Results  Component Value Date   INSULIN 11.3 06/25/2019   INSULIN 15.4 07/13/2018   2. Binge eating disorder Briana Brown is taking Vyvanse 30 mg daily for BED.  She will continue this dose.  Refill sent to pharmacy today.   - Refill lisdexamfetamine (VYVANSE) 30 MG capsule; Take 1 capsule (30 mg total) by mouth daily before breakfast.  Dispense: 30 capsule; Refill: 0 - lisdexamfetamine (VYVANSE) 30 MG capsule; Take 1 capsule (30 mg total) by mouth daily before breakfast.  Dispense: 30 capsule; Refill: 0 - lisdexamfetamine (VYVANSE) 30 MG capsule; Take 1 capsule (30 mg total) by mouth daily before breakfast.  Dispense: 30 capsule; Refill: 0  I have consulted the [place] Controlled Substances Registry for this patient, and feel the risk/benefit ratio today is favorable for  proceeding with this prescription for a controlled substance. The patient understands monitoring parameters and red flags.   3. Anxiety, with emotional eating Briana Brown is taking Wellbutrin XL 150 mg daily.   Plan:  Continue Wellbutrin.  Will also provide a letter of medical necessity for message therapy.    4. Obesity, current BMI 31.8  Course: Briana Brown is currently in the action stage of change. As such, her goal is to continue with weight loss efforts.   Nutrition goals: She has agreed to the Category 2 Plan.   Exercise goals:  As is.  Behavioral modification strategies: increasing lean protein intake, decreasing simple carbohydrates, increasing vegetables, and increasing water intake.  Briana Brown has agreed to follow-up with our clinic in 4 weeks. She was informed of the importance of frequent follow-up visits to maximize her success with intensive lifestyle modifications for her multiple health conditions.   Objective:   Blood pressure 114/73, pulse 96, temperature 97.9 F (36.6 C), temperature source Oral, height 5\' 6"  (1.676 m), weight 196 lb (88.9 kg), SpO2 96 %. Body mass index is 31.64 kg/m.  General: Cooperative, alert, well developed, in no acute distress. HEENT: Conjunctivae and lids unremarkable. Cardiovascular: Regular rhythm.  Lungs: Normal work of breathing. Neurologic: No focal deficits.   Lab Results  Component Value Date   CREATININE 0.9 07/17/2020   BUN 10 07/17/2020   NA 139 07/17/2020   K 4.4 07/17/2020   CL 103 07/17/2020   CO2 19 07/17/2020   Lab Results  Component Value Date   ALT 15 07/17/2020  AST 19 07/17/2020   ALKPHOS 97 07/17/2020   BILITOT 0.3 06/25/2019   Lab Results  Component Value Date   HGBA1C 5.2 06/25/2019   HGBA1C 5.2 07/13/2018   Lab Results  Component Value Date   INSULIN 11.3 06/25/2019   INSULIN 15.4 07/13/2018   Lab Results  Component Value Date   TSH 0.477 07/13/2018   Lab Results  Component Value Date   CHOL  190 06/25/2019   HDL 46 06/25/2019   LDLCALC 133 (H) 06/25/2019   TRIG 57 06/25/2019   Lab Results  Component Value Date   VD25OH 37.5 06/25/2019   VD25OH 56.7 07/13/2018   Lab Results  Component Value Date   WBC 7.2 07/17/2020   HGB 13.8 07/17/2020   HCT 40 07/17/2020   MCV 88 07/13/2018   PLT 346 07/17/2020   Attestation Statements:   Reviewed by clinician on day of visit: allergies, medications, problem list, medical history, surgical history, family history, social history, and previous encounter notes.  I, Water quality scientist, CMA, am acting as transcriptionist for Briscoe Deutscher, DO  I have reviewed the above documentation for accuracy and completeness, and I agree with the above. -  Briscoe Deutscher, DO, MS, FAAFP, DABOM - Family and Bariatric Medicine.

## 2021-05-29 ENCOUNTER — Other Ambulatory Visit (INDEPENDENT_AMBULATORY_CARE_PROVIDER_SITE_OTHER): Payer: Self-pay | Admitting: Family Medicine

## 2021-05-29 DIAGNOSIS — F5081 Binge eating disorder: Secondary | ICD-10-CM

## 2021-05-29 MED ORDER — LISDEXAMFETAMINE DIMESYLATE 30 MG PO CAPS: 30.0000 mg | ORAL_CAPSULE | Freq: Every day | ORAL | 0 refills | Status: DC

## 2021-06-22 ENCOUNTER — Ambulatory Visit (INDEPENDENT_AMBULATORY_CARE_PROVIDER_SITE_OTHER): Payer: BC Managed Care – PPO | Admitting: Family Medicine

## 2021-06-22 ENCOUNTER — Other Ambulatory Visit: Payer: Self-pay

## 2021-06-22 ENCOUNTER — Encounter (INDEPENDENT_AMBULATORY_CARE_PROVIDER_SITE_OTHER): Payer: Self-pay | Admitting: Family Medicine

## 2021-06-22 VITALS — BP 123/84 | HR 121 | Temp 99.1°F | Ht 66.0 in | Wt 198.0 lb

## 2021-06-22 DIAGNOSIS — Z6832 Body mass index (BMI) 32.0-32.9, adult: Secondary | ICD-10-CM

## 2021-06-22 DIAGNOSIS — F5081 Binge eating disorder: Secondary | ICD-10-CM | POA: Diagnosis not present

## 2021-06-22 DIAGNOSIS — R7301 Impaired fasting glucose: Secondary | ICD-10-CM | POA: Diagnosis not present

## 2021-06-22 DIAGNOSIS — Z6834 Body mass index (BMI) 34.0-34.9, adult: Secondary | ICD-10-CM

## 2021-06-22 DIAGNOSIS — E669 Obesity, unspecified: Secondary | ICD-10-CM

## 2021-06-22 DIAGNOSIS — E039 Hypothyroidism, unspecified: Secondary | ICD-10-CM | POA: Diagnosis not present

## 2021-06-23 ENCOUNTER — Encounter (INDEPENDENT_AMBULATORY_CARE_PROVIDER_SITE_OTHER): Payer: Self-pay | Admitting: Family Medicine

## 2021-06-23 DIAGNOSIS — F50819 Binge eating disorder, unspecified: Secondary | ICD-10-CM

## 2021-06-23 DIAGNOSIS — R7301 Impaired fasting glucose: Secondary | ICD-10-CM

## 2021-06-23 DIAGNOSIS — F5081 Binge eating disorder: Secondary | ICD-10-CM

## 2021-06-23 MED ORDER — OZEMPIC (0.25 OR 0.5 MG/DOSE) 2 MG/1.5ML ~~LOC~~ SOPN: 0.2500 mg | PEN_INJECTOR | SUBCUTANEOUS | 0 refills | Status: DC

## 2021-06-23 MED ORDER — LISDEXAMFETAMINE DIMESYLATE 40 MG PO CAPS: 40.0000 mg | ORAL_CAPSULE | ORAL | 0 refills | Status: DC

## 2021-06-23 NOTE — Telephone Encounter (Signed)
Pt last seen by Dr. Wallace.  

## 2021-06-23 NOTE — Progress Notes (Signed)
Chief Complaint:   OBESITY Briana Brown is here to discuss her progress with her obesity treatment plan along with follow-up of her obesity related diagnoses. See Medical Weight Management Flowsheet for complete bioelectrical impedance results.  Today's visit was #: 23 Starting weight: 213 lbs Starting date: 07/13/2018 Weight change since last visit: +2 lbs Total lbs lost to date: 15 lbs Total weight loss percentage to date: -7.04%  Nutrition Plan: Category 2 Plan for 75% of the time.  Activity: Walking/strength training for 30-60 minutes 4 times per week.   Interim History: Briana Brown turned 74 and had 2 surprise birthday parties along with the Christmas holiday since her last visit.    Challenge:  Snacking more at night.  Assessment/Plan:   1. Impaired fasting glucose, with polyphagia Not at goal. Current treatment: None.    Plan:  Start Ozempic 0.25 mg subcutaneously weekly.  She will try Victoza/Saxenda if Ozempic is not covered. She will continue to focus on protein-rich, low simple carbohydrate foods. We reviewed the importance of hydration, regular exercise for stress reduction, and restorative sleep.  2. Acquired hypothyroidism Course: Controlled. Medication: levothyroxine 150 mcg daily.   Plan: Patient was instructed not to take MVM or iron within 4 hours of taking thyroid medications.  We will continue to monitor alongside Endocrinology/PCP as it relates to her weight loss journey.   Lab Results  Component Value Date   TSH 0.477 07/13/2018   3. Binge eating disorder, ADHD Not at goal. Medication:Vyvanse 30 mg daily.  Counseling ADHD is the most missed diagnosis in relation to food and appetite problems. Often the strong urge to binge or to self-medicate with food subsides once the impulsivity and inattention of ADHD are treated. A person can experience a new ability to tune in to the body's signals, control cravings and improve impulse control.  A deficiency in  norepinephrine and dopamine can lead to the following behaviors related to eating: Poor awareness of internal cues of hunger and satiety, or fullness. Inability to follow a meal plan. Inability to judge portion size accurately. Inability to stop bingeing or purging. Distraction by continual thoughts of food, weight and body shape. Increased desire to overeat, especially high calorie, "reward" type foods. Poor self-esteem due to repeated failures of self-control.  Plan: Increase Vyvanse to 40 mg daily.  I have consulted the [place] Controlled Substances Registry for this patient, and feel the risk/benefit ratio today is favorable for proceeding with this prescription for a controlled substance. The patient understands monitoring parameters and red flags.   4. Obesity, current BMI 32.1  Course: Briana Brown is currently in the action stage of change. As such, her goal is to continue with weight loss efforts.   Nutrition goals: She has agreed to the Category 2 Plan.   Exercise goals:  As is.  Behavioral modification strategies: increasing lean protein intake, decreasing simple carbohydrates, and increasing vegetables.  Briana Brown has agreed to follow-up with our clinic in 4 weeks. She was informed of the importance of frequent follow-up visits to maximize her success with intensive lifestyle modifications for her multiple health conditions.   Objective:   Blood pressure 123/84, pulse (!) 121, temperature 99.1 F (37.3 C), temperature source Oral, height 5\' 6"  (1.676 m), weight 198 lb (89.8 kg), SpO2 99 %. Body mass index is 31.96 kg/m.  General: Cooperative, alert, well developed, in no acute distress. HEENT: Conjunctivae and lids unremarkable. Cardiovascular: Regular rhythm.  Lungs: Normal work of breathing. Neurologic: No focal deficits.  Lab Results  Component Value Date   CREATININE 0.9 07/17/2020   BUN 10 07/17/2020   NA 139 07/17/2020   K 4.4 07/17/2020   CL 103 07/17/2020   CO2  19 07/17/2020   Lab Results  Component Value Date   ALT 15 07/17/2020   AST 19 07/17/2020   ALKPHOS 97 07/17/2020   BILITOT 0.3 06/25/2019   Lab Results  Component Value Date   HGBA1C 5.2 06/25/2019   HGBA1C 5.2 07/13/2018   Lab Results  Component Value Date   INSULIN 11.3 06/25/2019   INSULIN 15.4 07/13/2018   Lab Results  Component Value Date   TSH 0.477 07/13/2018   Lab Results  Component Value Date   CHOL 190 06/25/2019   HDL 46 06/25/2019   LDLCALC 133 (H) 06/25/2019   TRIG 57 06/25/2019   Lab Results  Component Value Date   VD25OH 37.5 06/25/2019   VD25OH 56.7 07/13/2018   Lab Results  Component Value Date   WBC 7.2 07/17/2020   HGB 13.8 07/17/2020   HCT 40 07/17/2020   MCV 88 07/13/2018   PLT 346 07/17/2020   Attestation Statements:   Reviewed by clinician on day of visit: allergies, medications, problem list, medical history, surgical history, family history, social history, and previous encounter notes.  I, Water quality scientist, CMA, am acting as transcriptionist for Briscoe Deutscher, DO  I have reviewed the above documentation for accuracy and completeness, and I agree with the above. -  Briscoe Deutscher, DO, MS, FAAFP, DABOM - Family and Bariatric Medicine.

## 2021-06-24 MED ORDER — LISDEXAMFETAMINE DIMESYLATE 40 MG PO CAPS: 40.0000 mg | ORAL_CAPSULE | ORAL | 0 refills | Status: DC

## 2021-06-24 MED ORDER — OZEMPIC (0.25 OR 0.5 MG/DOSE) 2 MG/1.5ML ~~LOC~~ SOPN: 0.2500 mg | PEN_INJECTOR | SUBCUTANEOUS | 0 refills | Status: DC

## 2021-06-30 DIAGNOSIS — M0579 Rheumatoid arthritis with rheumatoid factor of multiple sites without organ or systems involvement: Secondary | ICD-10-CM | POA: Diagnosis not present

## 2021-07-01 MED ORDER — AMOXICILLIN-POT CLAVULANATE 875-125 MG PO TABS: 1.0000 | ORAL_TABLET | Freq: Two times a day (BID) | ORAL | 0 refills | Status: DC

## 2021-07-01 MED ORDER — PREDNISONE 5 MG PO TABS: ORAL_TABLET | ORAL | 0 refills | Status: DC

## 2021-07-20 ENCOUNTER — Encounter (INDEPENDENT_AMBULATORY_CARE_PROVIDER_SITE_OTHER): Payer: Self-pay | Admitting: Family Medicine

## 2021-07-20 ENCOUNTER — Ambulatory Visit (INDEPENDENT_AMBULATORY_CARE_PROVIDER_SITE_OTHER): Payer: BC Managed Care – PPO | Admitting: Family Medicine

## 2021-07-20 ENCOUNTER — Other Ambulatory Visit: Payer: Self-pay

## 2021-07-20 VITALS — BP 122/76 | HR 105 | Temp 98.0°F | Ht 66.0 in | Wt 196.0 lb

## 2021-07-20 DIAGNOSIS — E039 Hypothyroidism, unspecified: Secondary | ICD-10-CM

## 2021-07-20 DIAGNOSIS — Z6831 Body mass index (BMI) 31.0-31.9, adult: Secondary | ICD-10-CM

## 2021-07-20 DIAGNOSIS — E669 Obesity, unspecified: Secondary | ICD-10-CM | POA: Diagnosis not present

## 2021-07-20 DIAGNOSIS — F5081 Binge eating disorder: Secondary | ICD-10-CM | POA: Diagnosis not present

## 2021-07-20 DIAGNOSIS — R7301 Impaired fasting glucose: Secondary | ICD-10-CM

## 2021-07-20 MED ORDER — LISDEXAMFETAMINE DIMESYLATE 40 MG PO CAPS: 40.0000 mg | ORAL_CAPSULE | ORAL | 0 refills | Status: DC

## 2021-07-20 MED ORDER — SEMAGLUTIDE (1 MG/DOSE) 4 MG/3ML ~~LOC~~ SOPN: 1.0000 mg | PEN_INJECTOR | SUBCUTANEOUS | 3 refills | Status: DC

## 2021-07-22 ENCOUNTER — Encounter (INDEPENDENT_AMBULATORY_CARE_PROVIDER_SITE_OTHER): Payer: Self-pay | Admitting: Family Medicine

## 2021-07-22 ENCOUNTER — Other Ambulatory Visit (INDEPENDENT_AMBULATORY_CARE_PROVIDER_SITE_OTHER): Payer: Self-pay | Admitting: Family Medicine

## 2021-07-22 DIAGNOSIS — E669 Obesity, unspecified: Secondary | ICD-10-CM

## 2021-07-22 DIAGNOSIS — F5081 Binge eating disorder: Secondary | ICD-10-CM

## 2021-07-22 NOTE — Telephone Encounter (Signed)
Dr.Wallace °

## 2021-07-22 NOTE — Progress Notes (Signed)
Chief Complaint:   OBESITY Briana Brown is here to discuss her progress with her obesity treatment plan along with follow-up of her obesity related diagnoses. See Medical Weight Management Flowsheet for complete bioelectrical impedance results.  Today's visit was #: 31 Starting weight: 213 lbs Starting date: 07/13/2018 Weight change since last visit: 2 lbs Total lbs lost to date: 17 lbs Total weight loss percentage to date: -7.98%  Nutrition Plan: Category 2 Plan for 75% of the time.  Activity: Cardio/strength training/yoga for 60 minutes 4-5 times per week.  Anti-obesity medications: Ozempic 0.25 mg subcutaneously weekly. Reported side effects: None.  Interim History: Briana Brown was at a conference recently.  She says she walked a lot.  Assessment/Plan:   1. Impaired fasting glucose, with polyphagia Not at goal. Current treatment: Ozempic 0.25 mg subcutaneously weekly.    Plan: She will continue to focus on protein-rich, low simple carbohydrate foods. We reviewed the importance of hydration, regular exercise for stress reduction, and restorative sleep.  - Refill Semaglutide, 1 MG/DOSE, 4 MG/3ML SOPN; Inject 1 mg as directed once a week.  Dispense: 3 mL; Refill: 3  2. Acquired hypothyroidism Course: Controlled. Medication: levothyroxine 150 mcg daily.   Plan: Patient was instructed not to take MVM or iron within 4 hours of taking thyroid medications. We will continue to monitor alongside Endocrinology/PCP as it relates to her weight loss journey.   Lab Results  Component Value Date   TSH 0.477 07/13/2018   3. Binge eating disorder, ADHD Medication:Not at goal. Medication:Vyvanse 40 mg daily.  Counseling ADHD is the most missed diagnosis in relation to food and appetite problems. Often the strong urge to binge or to self-medicate with food subsides once the impulsivity and inattention of ADHD are treated. A person can experience a new ability to tune in to the body's signals,  control cravings and improve impulse control.  A deficiency in norepinephrine and dopamine can lead to the following behaviors related to eating: Poor awareness of internal cues of hunger and satiety, or fullness. Inability to follow a meal plan. Inability to judge portion size accurately. Inability to stop bingeing or purging. Distraction by continual thoughts of food, weight and body shape. Increased desire to overeat, especially high calorie, "reward" type foods. Poor self-esteem due to repeated failures of self-control.  Plan: Continue Vyvanse 40 mg daily.  Will refill today.  - Refill lisdexamfetamine (VYVANSE) 40 MG capsule; Take 1 capsule (40 mg total) by mouth every morning.  Dispense: 30 capsule; Refill: 0  I have consulted the [place] Controlled Substances Registry for this patient, and feel the risk/benefit ratio today is favorable for proceeding with this prescription for a controlled substance. The patient understands monitoring parameters and red flags.   4. Obesity, current BMI 31.8  Course: Briana Brown is currently in the action stage of change. As such, her goal is to continue with weight loss efforts.   Nutrition goals: She has agreed to the Category 2 Plan.   Exercise goals:  As is.  Behavioral modification strategies: increasing lean protein intake, decreasing simple carbohydrates, and increasing vegetables.  [name] has agreed to follow-up with our clinic in 4 weeks. She was informed of the importance of frequent follow-up visits to maximize her success with intensive lifestyle modifications for her multiple health conditions.   Objective:   Blood pressure 122/76, pulse (!) 105, temperature 98 F (36.7 C), temperature source Oral, height 5\' 6"  (1.676 m), weight 196 lb (88.9 kg), SpO2 100 %. Body mass index  is 31.64 kg/m.  General: Cooperative, alert, well developed, in no acute distress. HEENT: Conjunctivae and lids unremarkable. Cardiovascular: Regular rhythm.   Lungs: Normal work of breathing. Neurologic: No focal deficits.   Lab Results  Component Value Date   CREATININE 0.9 07/17/2020   BUN 10 07/17/2020   NA 139 07/17/2020   K 4.4 07/17/2020   CL 103 07/17/2020   CO2 19 07/17/2020   Lab Results  Component Value Date   ALT 15 07/17/2020   AST 19 07/17/2020   ALKPHOS 97 07/17/2020   BILITOT 0.3 06/25/2019   Lab Results  Component Value Date   HGBA1C 5.2 06/25/2019   HGBA1C 5.2 07/13/2018   Lab Results  Component Value Date   INSULIN 11.3 06/25/2019   INSULIN 15.4 07/13/2018   Lab Results  Component Value Date   TSH 0.477 07/13/2018   Lab Results  Component Value Date   CHOL 190 06/25/2019   HDL 46 06/25/2019   LDLCALC 133 (H) 06/25/2019   TRIG 57 06/25/2019   Lab Results  Component Value Date   VD25OH 37.5 06/25/2019   VD25OH 56.7 07/13/2018   Lab Results  Component Value Date   WBC 7.2 07/17/2020   HGB 13.8 07/17/2020   HCT 40 07/17/2020   MCV 88 07/13/2018   PLT 346 07/17/2020   Attestation Statements:   Reviewed by clinician on day of visit: allergies, medications, problem list, medical history, surgical history, family history, social history, and previous encounter notes.  I, Water quality scientist, CMA, am acting as transcriptionist for Briscoe Deutscher, DO  I have reviewed the above documentation for accuracy and completeness, and I agree with the above. -  Briscoe Deutscher, DO, MS, FAAFP, DABOM - Family and Bariatric Medicine.

## 2021-07-27 ENCOUNTER — Encounter (INDEPENDENT_AMBULATORY_CARE_PROVIDER_SITE_OTHER): Payer: Self-pay

## 2021-07-27 MED ORDER — WEGOVY 0.25 MG/0.5ML ~~LOC~~ SOAJ: 0.2500 mg | SUBCUTANEOUS | 0 refills | Status: DC

## 2021-07-27 NOTE — Progress Notes (Signed)
Benito Mccreedy D.[place] Phone: [phone]   Assessment and Plan:     1. Hip pain -Chronic with exacerbation, initial sports medicine visit - Unclear etiology of waxing and waning right hip pain along iliac crest, worsening over the weekend without significant MOI - Suspect strain of core muscles, quadratus lumborum on right due to patient's increased activity and possibly due to posturing during the day - X-rays obtained in clinic.  My interpretation: Mild spurring at acetabulum and femoral head, mild enthesophyte at right ASIS.  No acute fracture or dislocation - Start meloxicam 15 mg daily x2 weeks.  If still having pain after 2 weeks, complete 3rd-week of meloxicam. May use remaining meloxicam as needed once daily for pain control.  Do not to use additional NSAIDs while taking meloxicam.  May use Tylenol [phone] mg to 3 times a day for breakthrough pain. - Start HEP for core exercising and QL - DG HIP UNILAT WITH PELVIS 2-3 VIEWS RIGHT; Future    Pertinent previous records reviewed include none   Follow Up: 3 to 4 weeks for reevaluation.  Could consider formal PT versus advanced imaging if no improvement or worsening of symptoms   Subjective:   I, Briana Brown, am serving as a Education administrator for Doctor Glennon Mac   Chief Complaint: latera right l hip pain   HPI:  07/28/2021  Patient is a 41 year old female complaining of outer hip pain . Patient states her hip has been bugging her for a really long time , lateral hip sharp pain radiates down to mid quad and to the front been going on for about a year on and off , No MOI , has a hx of RA, has infusion every 8 weeks , does sleep under a weighted blanket , once she decreased the weight of her blanked she has noticed that has helped her hip pain, is doing a stretch and rolling routine but feels like she is on fire after no numbness tingling , cant tell if the  tylenol is working , does go into her lower back    Relevant Historical Information: Rheumatoid arthritis  Additional pertinent review of systems negative.   Current Outpatient Medications:    amoxicillin-clavulanate (AUGMENTIN) 875-125 MG tablet, Take 1 tablet by mouth 2 (two) times daily., Disp: 20 tablet, Rfl: 0   Biotin 5000 MCG TABS, Take 1 tablet by mouth daily., Disp: , Rfl:    buPROPion (WELLBUTRIN XL) 150 MG 24 hr tablet, Take 150 mg by mouth daily., Disp: , Rfl:    cholecalciferol (VITAMIN D3) 25 MCG (1000 UT) tablet, Take 3,000 Units by mouth daily. 3000 units daily, Disp: , Rfl:    Golimumab 50 MG/0.5ML SOAJ, Inject into the skin. 2 mg/kg  every 8 weeks infusion, Disp: , Rfl:    levothyroxine (SYNTHROID, LEVOTHROID) 150 MCG tablet, Take 150 mcg by mouth daily before breakfast., Disp: , Rfl:    lisdexamfetamine (VYVANSE) 40 MG capsule, Take 1 capsule (40 mg total) by mouth every morning., Disp: 30 capsule, Rfl: 0   loratadine (CLARITIN) 10 MG tablet, Take 10 mg by mouth daily., Disp: , Rfl:    Magnesium 200 MG TABS, Take 1 tablet by mouth daily., Disp: , Rfl:    Melatonin 5 MG TABS, Take 1 tablet by mouth daily., Disp: , Rfl:    meloxicam (MOBIC) 15 MG tablet, Take 1 tablet (15 mg total) by mouth daily., Disp: 30 tablet,  Rfl: 0   Multiple Vitamins-Minerals (MULTIVITAMIN WITH MINERALS) tablet, Take 1 tablet by mouth daily., Disp: , Rfl:    predniSONE (DELTASONE) 5 MG tablet, 6-5-4-3-2-1-off, Disp: 21 tablet, Rfl: 0   Semaglutide-Weight Management (WEGOVY) 0.25 MG/0.5ML SOAJ, Inject 0.25 mg into the skin once a week., Disp: 2 mL, Rfl: 0   montelukast (SINGULAIR) 10 MG tablet, Take 1 tablet (10 mg total) by mouth at bedtime., Disp: 90 tablet, Rfl: 3   Objective:     Vitals:   07/28/21 0824  BP: 122/70  Pulse: (!) 110  SpO2: 99%  Weight: 200 lb (90.7 kg)  Height: 5\' 6"  (1.676 m)      Body mass index is 32.28 kg/m.    Physical Exam:    General: awake, alert, and  oriented no acute distress, nontoxic Skin: no suspicious lesions or rashes Neuro:sensation intact distally with no dificits, normal muscle tone, no atrophy, strength 5/5 in all tested lower ext groups Psych: normal mood and affect, speech clear  Right hip: No deformity, swelling or wasting ROM Fexion 90, ext 30, IR 45, ER 45 TTP iliac crest from midpoint to posterior NTTP over the hip flexors, greater troch, glute musculature, si joint, lumbar spine Negative log roll with FROM Negative FABER Negative FADIR Negative Piriformis test Negative trendelenberg Gait normal    Electronically signed by:  Benito Mccreedy D.[facility] 10:29 AM 07/28/21

## 2021-07-27 NOTE — Telephone Encounter (Signed)
Prior authorization has been started and approved for [facility].

## 2021-07-28 ENCOUNTER — Other Ambulatory Visit: Payer: Self-pay

## 2021-07-28 ENCOUNTER — Ambulatory Visit (INDEPENDENT_AMBULATORY_CARE_PROVIDER_SITE_OTHER): Payer: BC Managed Care – PPO

## 2021-07-28 ENCOUNTER — Ambulatory Visit (INDEPENDENT_AMBULATORY_CARE_PROVIDER_SITE_OTHER): Payer: BC Managed Care – PPO | Admitting: Sports Medicine

## 2021-07-28 ENCOUNTER — Other Ambulatory Visit: Payer: Self-pay | Admitting: Sports Medicine

## 2021-07-28 VITALS — BP 122/70 | HR 110 | Ht 66.0 in | Wt 200.0 lb

## 2021-07-28 DIAGNOSIS — M0579 Rheumatoid arthritis with rheumatoid factor of multiple sites without organ or systems involvement: Secondary | ICD-10-CM | POA: Insufficient documentation

## 2021-07-28 DIAGNOSIS — G9332 Myalgic encephalomyelitis/chronic fatigue syndrome: Secondary | ICD-10-CM | POA: Insufficient documentation

## 2021-07-28 DIAGNOSIS — E039 Hypothyroidism, unspecified: Secondary | ICD-10-CM | POA: Insufficient documentation

## 2021-07-28 DIAGNOSIS — Z79899 Other long term (current) drug therapy: Secondary | ICD-10-CM | POA: Insufficient documentation

## 2021-07-28 DIAGNOSIS — M25559 Pain in unspecified hip: Secondary | ICD-10-CM

## 2021-07-28 DIAGNOSIS — M2550 Pain in unspecified joint: Secondary | ICD-10-CM | POA: Insufficient documentation

## 2021-07-28 DIAGNOSIS — M25551 Pain in right hip: Secondary | ICD-10-CM

## 2021-07-28 DIAGNOSIS — R768 Other specified abnormal immunological findings in serum: Secondary | ICD-10-CM | POA: Insufficient documentation

## 2021-07-28 DIAGNOSIS — G43009 Migraine without aura, not intractable, without status migrainosus: Secondary | ICD-10-CM | POA: Insufficient documentation

## 2021-07-28 DIAGNOSIS — M069 Rheumatoid arthritis, unspecified: Secondary | ICD-10-CM | POA: Insufficient documentation

## 2021-07-28 MED ORDER — MELOXICAM 15 MG PO TABS: 15.0000 mg | ORAL_TABLET | Freq: Every day | ORAL | 0 refills | Status: DC

## 2021-07-28 NOTE — Patient Instructions (Addendum)
Good to see you  Start meloxicam 15 mg daily x2 weeks.  If still having pain after 2 weeks, complete 3rd-week of meloxicam. May use remaining meloxicam as needed once daily for pain control.   Do not to use additional NSAIDs while taking meloxicam.   May use Tylenol [phone] mg to 3 times a day for breakthrough pain. Core posterior chain HEP 4 week follow up

## 2021-08-18 ENCOUNTER — Encounter (INDEPENDENT_AMBULATORY_CARE_PROVIDER_SITE_OTHER): Payer: Self-pay

## 2021-08-18 ENCOUNTER — Ambulatory Visit (INDEPENDENT_AMBULATORY_CARE_PROVIDER_SITE_OTHER): Payer: BC Managed Care – PPO | Admitting: Family Medicine

## 2021-08-21 ENCOUNTER — Other Ambulatory Visit (INDEPENDENT_AMBULATORY_CARE_PROVIDER_SITE_OTHER): Payer: Self-pay | Admitting: Family Medicine

## 2021-08-21 DIAGNOSIS — E669 Obesity, unspecified: Secondary | ICD-10-CM

## 2021-08-21 DIAGNOSIS — Z6834 Body mass index (BMI) 34.0-34.9, adult: Secondary | ICD-10-CM

## 2021-08-21 DIAGNOSIS — F5081 Binge eating disorder: Secondary | ICD-10-CM

## 2021-08-23 ENCOUNTER — Other Ambulatory Visit (INDEPENDENT_AMBULATORY_CARE_PROVIDER_SITE_OTHER): Payer: Self-pay | Admitting: Family Medicine

## 2021-08-23 DIAGNOSIS — Z6834 Body mass index (BMI) 34.0-34.9, adult: Secondary | ICD-10-CM

## 2021-08-23 DIAGNOSIS — F5081 Binge eating disorder: Secondary | ICD-10-CM

## 2021-08-23 DIAGNOSIS — E669 Obesity, unspecified: Secondary | ICD-10-CM

## 2021-08-24 MED ORDER — LISDEXAMFETAMINE DIMESYLATE 40 MG PO CAPS: 40.0000 mg | ORAL_CAPSULE | ORAL | 0 refills | Status: DC

## 2021-08-24 MED ORDER — WEGOVY 0.25 MG/0.5ML ~~LOC~~ SOAJ: 0.2500 mg | SUBCUTANEOUS | 0 refills | Status: DC

## 2021-08-24 NOTE — Telephone Encounter (Signed)
Duplicate request. Will deny when other is sent ?

## 2021-08-24 NOTE — Telephone Encounter (Signed)
Dr.Wallace °

## 2021-08-25 DIAGNOSIS — M0579 Rheumatoid arthritis with rheumatoid factor of multiple sites without organ or systems involvement: Secondary | ICD-10-CM | POA: Diagnosis not present

## 2021-08-25 DIAGNOSIS — Z79899 Other long term (current) drug therapy: Secondary | ICD-10-CM | POA: Diagnosis not present

## 2021-08-26 ENCOUNTER — Ambulatory Visit: Payer: BC Managed Care – PPO | Admitting: Sports Medicine

## 2021-08-26 ENCOUNTER — Encounter (INDEPENDENT_AMBULATORY_CARE_PROVIDER_SITE_OTHER): Payer: Self-pay | Admitting: Family Medicine

## 2021-08-26 NOTE — Telephone Encounter (Signed)
Dr.Wallace °

## 2021-09-14 ENCOUNTER — Encounter (INDEPENDENT_AMBULATORY_CARE_PROVIDER_SITE_OTHER): Payer: Self-pay | Admitting: Family Medicine

## 2021-09-14 ENCOUNTER — Ambulatory Visit (INDEPENDENT_AMBULATORY_CARE_PROVIDER_SITE_OTHER): Payer: BC Managed Care – PPO | Admitting: Family Medicine

## 2021-09-14 VITALS — BP 123/81 | HR 93 | Temp 97.9°F | Ht 66.0 in | Wt 193.0 lb

## 2021-09-14 DIAGNOSIS — E669 Obesity, unspecified: Secondary | ICD-10-CM | POA: Diagnosis not present

## 2021-09-14 DIAGNOSIS — R632 Polyphagia: Secondary | ICD-10-CM

## 2021-09-14 DIAGNOSIS — E038 Other specified hypothyroidism: Secondary | ICD-10-CM

## 2021-09-14 DIAGNOSIS — F5081 Binge eating disorder: Secondary | ICD-10-CM

## 2021-09-14 DIAGNOSIS — Z6831 Body mass index (BMI) 31.0-31.9, adult: Secondary | ICD-10-CM

## 2021-09-14 MED ORDER — WEGOVY 1 MG/0.5ML ~~LOC~~ SOAJ: 1.0000 mg | SUBCUTANEOUS | 1 refills | Status: DC

## 2021-09-14 MED ORDER — WEGOVY 0.5 MG/0.5ML ~~LOC~~ SOAJ: 0.5000 mg | SUBCUTANEOUS | 1 refills | Status: DC

## 2021-09-14 MED ORDER — LISDEXAMFETAMINE DIMESYLATE 40 MG PO CAPS: 40.0000 mg | ORAL_CAPSULE | Freq: Every day | ORAL | 0 refills | Status: DC

## 2021-09-14 MED ORDER — LISDEXAMFETAMINE DIMESYLATE 40 MG PO CAPS
40.0000 mg | ORAL_CAPSULE | Freq: Every day | ORAL | 0 refills | Status: DC
Start: 2021-11-13 — End: 2024-05-03

## 2021-09-16 MED ORDER — LEVOTHYROXINE SODIUM 150 MCG PO TABS: 150.0000 ug | ORAL_TABLET | Freq: Every day | ORAL | 0 refills | Status: DC

## 2021-09-16 NOTE — Progress Notes (Signed)
Chief Complaint:   OBESITY Briana Brown is here to discuss her progress with her obesity treatment plan along with follow-up of her obesity related diagnoses.   Today's visit was #: 52 Starting weight: 213 lbs Starting date: 07/13/2018 Today's weight: 193 lbs Today's date: 09/14/2021 Weight change since last visit: -3 lbs Total lbs lost to date: 20 lbs Body mass index is 31.15 kg/m.  Total weight loss percentage to date: -9.93%  Current Meal Plan: the Category 2 Plan for 85% of the time.  Current Exercise Plan: Cardio/strength training for 60 minutes 3-4 times per week. Current Anti-Obesity Medications: Wegovy 0.25 mg subcutaneously weekly. Side effects: None.  Assessment/Plan:   1. Polyphagia Not at goal. Current treatment: Wegovy 0.25 mg subcutaneously weekly.   Plan: Start Wegovy 0.5 mg then increase to 1 mg. She will continue to focus on protein-rich, low simple carbohydrate foods. We reviewed the importance of hydration, regular exercise for stress reduction, and restorative sleep. - Start Semaglutide-Weight Management (WEGOVY) 0.5 MG/0.5ML SOAJ; Inject 0.5 mg into the skin once a week.  Dispense: 2 mL; Refill: 1 - Then start Semaglutide-Weight Management (WEGOVY) 1 MG/0.5ML SOAJ; Inject 1 mg into the skin once a week.  Dispense: 2 mL; Refill: 1  2. Other specified hypothyroidism Course: Controlled. Medication: Synthroid 150 mcg daily.   Plan: Patient was instructed not to take MVM or iron within 4 hours of taking thyroid medications. We will continue to monitor alongside Endocrinology/PCP as it relates to her weight loss journey. We will refill Synthroid 150 mcg daily.  Lab Results  Component Value Date   TSH 0.477 07/13/2018   3. Binge eating disorder, ADHD Medication:Not at goal. Medication:Vyvanse 40 mg daily.   Counseling ADHD is the most missed diagnosis in relation to food and appetite problems. Often  the strong urge to binge or to self-medicate with food subsides once the impulsivity and inattention of ADHD are treated. A person can experience a new ability to tune in to the body's signals, control cravings and improve impulse control.   A deficiency in norepinephrine and dopamine can lead to the following behaviors related to eating: Poor awareness of internal cues of hunger and satiety, or fullness. Inability to follow a meal plan. Inability to judge portion size accurately. Inability to stop bingeing or purging. Distraction by continual thoughts of food, weight and body shape. Increased desire to overeat, especially high calorie, "reward" type foods. Poor self-esteem due to repeated failures of self-control.   Plan: Continue Vyvanse 40 mg daily.  Will refill today.  - Refill lisdexamfetamine (VYVANSE) 40 MG capsule; Take 1 capsule (40 mg total) by mouth daily before breakfast.  Dispense: 30 capsule; Refill: 0 - Refill lisdexamfetamine (VYVANSE) 40 MG capsule; Take 1 capsule (40 mg total) by mouth daily before breakfast.  Dispense: 30 capsule; Refill: 0 - Refill lisdexamfetamine (VYVANSE) 40 MG capsule; Take 1 capsule (40 mg total) by mouth daily before breakfast.  Dispense: 30 capsule; Refill: 0  I have consulted the [place] Controlled Substances Registry for this patient, and feel the risk/benefit ratio today is favorable for proceeding with this prescription for a controlled substance. The patient understands monitoring  parameters and red flags.   4. Obesity, current BMI 31.2 - Start Semaglutide-Weight Management (WEGOVY) 0.5 MG/0.5ML SOAJ; Inject 0.5 mg into the skin once a week.  Dispense: 2 mL; Refill: 1 - Then start Semaglutide-Weight Management (WEGOVY) 1 MG/0.5ML SOAJ; Inject 1 mg into the skin once a week.  Dispense: 2 mL; Refill: 1  Course: Briana Brown is currently in the action stage of change. As such, her goal is to continue with weight loss efforts.   Nutrition goals: She has  agreed to the Category 2 Plan.   Exercise goals: As is.  Behavioral modification strategies: increasing lean protein intake, decreasing simple carbohydrates, increasing vegetables, increasing water intake, and decreasing liquid calories.  Briana Brown has agreed to follow-up with our clinic in 6 weeks. She was informed of the importance of frequent follow-up visits to maximize her success with intensive lifestyle modifications for her multiple health conditions.   Objective:   Blood pressure 123/81, pulse 93, temperature 97.9 F (36.6 C), temperature source Oral, height '5\' 6"'$  (1.676 m), weight 193 lb (87.5 kg), SpO2 98 %. Body mass index is 31.15 kg/m.  General: Cooperative, alert, well developed, in no acute distress. HEENT: Conjunctivae and lids unremarkable. Cardiovascular: Regular rhythm.  Lungs: Normal work of breathing. Neurologic: No focal deficits.   Lab Results  Component Value Date   CREATININE 0.9 07/17/2020   BUN 10 07/17/2020   NA 139 07/17/2020   K 4.4 07/17/2020   CL 103 07/17/2020   CO2 19 07/17/2020   Lab Results  Component Value Date   ALT 15 07/17/2020   AST 19 07/17/2020   ALKPHOS 97 07/17/2020   BILITOT 0.3 06/25/2019   Lab Results  Component Value Date   HGBA1C 5.2 06/25/2019   HGBA1C 5.2 07/13/2018   Lab Results  Component Value Date   INSULIN 11.3 06/25/2019   INSULIN 15.4 07/13/2018   Lab Results  Component Value Date   TSH 0.477 07/13/2018   Lab Results  Component Value Date   CHOL 190 06/25/2019   HDL 46 06/25/2019   LDLCALC 133 (H) 06/25/2019   TRIG 57 06/25/2019   Lab Results  Component Value Date   VD25OH 37.5 06/25/2019   VD25OH 56.7 07/13/2018   Lab Results  Component Value Date   WBC 7.2 07/17/2020   HGB 13.8 07/17/2020   HCT 40 07/17/2020   MCV 88 07/13/2018   PLT 346 07/17/2020   Attestation Statements:   Reviewed by clinician on day of visit: allergies, medications, problem list, medical history, surgical history,  family history, social history, and previous encounter notes.  Leodis Binet Friedenbach, CMA, am acting as Location manager for [employer], DO.  I have reviewed the above documentation for accuracy and completeness, and I agree with the above. -  Briscoe Deutscher, DO, MS, FAAFP, DABOM - Family and Bariatric Medicine.

## 2021-09-22 ENCOUNTER — Other Ambulatory Visit: Payer: Self-pay | Admitting: Sports Medicine

## 2021-09-22 ENCOUNTER — Other Ambulatory Visit (INDEPENDENT_AMBULATORY_CARE_PROVIDER_SITE_OTHER): Payer: Self-pay | Admitting: Family Medicine

## 2021-09-22 DIAGNOSIS — R632 Polyphagia: Secondary | ICD-10-CM

## 2021-09-22 DIAGNOSIS — E669 Obesity, unspecified: Secondary | ICD-10-CM

## 2021-09-22 MED ORDER — WEGOVY 0.25 MG/0.5ML ~~LOC~~ SOAJ: 0.2500 mg | SUBCUTANEOUS | 1 refills | Status: DC

## 2021-09-22 MED ORDER — MELOXICAM 15 MG PO TABS: 15.0000 mg | ORAL_TABLET | Freq: Every day | ORAL | 0 refills | Status: DC

## 2021-09-22 MED ORDER — WEGOVY 0.5 MG/0.5ML ~~LOC~~ SOAJ: 0.5000 mg | SUBCUTANEOUS | 1 refills | Status: DC

## 2021-09-26 ENCOUNTER — Encounter (INDEPENDENT_AMBULATORY_CARE_PROVIDER_SITE_OTHER): Payer: Self-pay | Admitting: Family Medicine

## 2021-09-28 ENCOUNTER — Encounter (INDEPENDENT_AMBULATORY_CARE_PROVIDER_SITE_OTHER): Payer: Self-pay

## 2021-09-28 NOTE — Progress Notes (Signed)
? ? Briana Brown D.Merril Abbe ?[facility] ?[place] ?Phone: [phone] ?  ?Assessment and Plan:   ?  ?1. Right ankle pain, unspecified chronicity ?2. Pain of right hip ?3. Strain of right tibialis anterior muscle, initial encounter ?-Acute, uncomplicated, initial sports medicine visit ?- Likely strain of anterior tibialis muscle and tendon based on HPI, physical exam, unremarkable x-ray ?- Recommend wearing supportive tennis shoes x2 weeks ?- Start meloxicam 15 mg daily x2 weeks.  If still having pain after 2 weeks, complete 3rd-week of meloxicam. May use remaining meloxicam as needed once daily for pain control.  Do not to use additional NSAIDs while taking meloxicam.  May use Tylenol [phone] mg 2 to 3 times a day for breakthrough pain. ?- Start home exercises for ankle stretching and strengthening ?- X-ray obtained in clinic due to tenderness over navicular.  My interpretation: No acute fracture or dislocation. ?  ?Pertinent previous records reviewed include none ?  ?Follow Up: As needed if no improvement or worsening of symptoms ?  ?Subjective:   ?I, Brown Briana, am serving as a Education administrator for [name] ? ?Chief Complaint: right ankle pain  ? ?HPI:  ?09/29/2021 ?Patient is a 41 year old female complaining of right ankle pain. Patient states that she was in [place] doing a lot of walking , a lot of driving as well, no MOI today feels better today she is wearing tennis shoes, she stepped yesterday and kind of tripped over her foot she was barefoot felt immediate pain , put shoes on and the pain went away, wants to follow up about hip as well, has been using a brace from [facility] for high ankle sprains , cant walk in the brace , no numbness tingling,pain stays in the anterior ankle, has been taking tylenol and has been on the meloxicam  ? ?Relevant Historical Information: Rheumatoid arthritis ? ?Additional pertinent review of systems  negative. ? ? ?Current Outpatient Medications:  ?  Biotin 5000 MCG TABS, Take 1 tablet by mouth daily., Disp: , Rfl:  ?  buPROPion (WELLBUTRIN XL) 150 MG 24 hr tablet, Take 150 mg by mouth daily., Disp: , Rfl:  ?  cholecalciferol (VITAMIN D3) 25 MCG (1000 UT) tablet, Take 3,000 Units by mouth daily. 3000 units daily, Disp: , Rfl:  ?  Golimumab 50 MG/0.5ML SOAJ, Inject into the skin. 2 mg/kg  every 8 weeks infusion, Disp: , Rfl:  ?  levothyroxine (SYNTHROID) 150 MCG tablet, Take 1 tablet (150 mcg total) by mouth daily before breakfast., Disp: 90 tablet, Rfl: 0 ?  lisdexamfetamine (VYVANSE) 40 MG capsule, Take 1 capsule (40 mg total) by mouth every morning., Disp: 30 capsule, Rfl: 0 ?  lisdexamfetamine (VYVANSE) 40 MG capsule, Take 1 capsule (40 mg total) by mouth daily before breakfast., Disp: 30 capsule, Rfl: 0 ?  [START ON 10/14/2021] lisdexamfetamine (VYVANSE) 40 MG capsule, Take 1 capsule (40 mg total) by mouth daily before breakfast., Disp: 30 capsule, Rfl: 0 ?  [START ON 11/13/2021] lisdexamfetamine (VYVANSE) 40 MG capsule, Take 1 capsule (40 mg total) by mouth daily before breakfast., Disp: 30 capsule, Rfl: 0 ?  loratadine (CLARITIN) 10 MG tablet, Take 10 mg by mouth daily., Disp: , Rfl:  ?  Magnesium 200 MG TABS, Take 1 tablet by mouth daily., Disp: , Rfl:  ?  Melatonin 5 MG TABS, Take 1 tablet by mouth daily., Disp: , Rfl:  ?  meloxicam (MOBIC) 15 MG tablet, Take 1 tablet (  15 mg total) by mouth daily., Disp: 30 tablet, Rfl: 0 ?  Multiple Vitamins-Minerals (MULTIVITAMIN WITH MINERALS) tablet, Take 1 tablet by mouth daily., Disp: , Rfl:  ?  Semaglutide-Weight Management (WEGOVY) 0.25 MG/0.5ML SOAJ, Inject 0.25 mg into the skin once a week., Disp: 2 mL, Rfl: 1 ?  Semaglutide-Weight Management (WEGOVY) 0.5 MG/0.5ML SOAJ, Inject 0.5 mg into the skin once a week., Disp: 2 mL, Rfl: 1 ?  Semaglutide-Weight Management (WEGOVY) 1 MG/0.5ML SOAJ, Inject 1 mg into the skin once a week., Disp: 2 mL, Rfl: 1 ?  montelukast  (SINGULAIR) 10 MG tablet, Take 1 tablet (10 mg total) by mouth at bedtime., Disp: 90 tablet, Rfl: 3  ? ?Objective:   ?  ?Vitals:  ? 09/29/21 1316  ?BP: 120/72  ?Pulse: 71  ?SpO2: 98%  ?Weight: 197 lb (89.4 kg)  ?Height: '5\' 6"'$  (1.676 m)  ?  ?  ?Body mass index is 31.8 kg/m?.  ?  ?Physical Exam:   ? ?Gen: Appears well, nad, nontoxic and pleasant ?Psych: Alert and oriented, appropriate mood and affect ?Neuro: sensation intact, strength is 5/5 with df/pf/inv/ev, muscle tone wnl ?Skin: no susupicious lesions or rashes ? ?Ankle: no deformity, no swelling or effusion ?TTP navicular, anterior joint line ?NTTP over fibular head, lat mal, medial mal, achilles,    base of 5th, ATFL, CFL, deltoid, calcaneous or midfoot ?ROM DF 30, PF 45, inv/ev intact ?Negative ant drawer, talar tilt, rotation test, squeeze test. ?Neg thompson ?No pain with resisted inversion or eversion  ?Mild pain with resisted flexion and extension along anterior tibialis tendon/anterior ankle ? ?Electronically signed by:  ?Briana Brown D.Merril Abbe ?[facility] ?1:53 PM 09/29/21 ?

## 2021-09-28 NOTE — Telephone Encounter (Signed)
Does this need PA?

## 2021-09-28 NOTE — Telephone Encounter (Signed)
Prior authorization has already been started for Briana Brown. Will notify patient and provider once response is received.  ?

## 2021-09-29 ENCOUNTER — Ambulatory Visit (INDEPENDENT_AMBULATORY_CARE_PROVIDER_SITE_OTHER): Payer: BC Managed Care – PPO

## 2021-09-29 ENCOUNTER — Telehealth (INDEPENDENT_AMBULATORY_CARE_PROVIDER_SITE_OTHER): Payer: Self-pay | Admitting: Family Medicine

## 2021-09-29 ENCOUNTER — Ambulatory Visit (INDEPENDENT_AMBULATORY_CARE_PROVIDER_SITE_OTHER): Payer: BC Managed Care – PPO | Admitting: Sports Medicine

## 2021-09-29 ENCOUNTER — Encounter (INDEPENDENT_AMBULATORY_CARE_PROVIDER_SITE_OTHER): Payer: Self-pay

## 2021-09-29 VITALS — BP 120/72 | HR 71 | Ht 66.0 in | Wt 197.0 lb

## 2021-09-29 DIAGNOSIS — M25551 Pain in right hip: Secondary | ICD-10-CM

## 2021-09-29 DIAGNOSIS — M25571 Pain in right ankle and joints of right foot: Secondary | ICD-10-CM | POA: Diagnosis not present

## 2021-09-29 DIAGNOSIS — S86811A Strain of other muscle(s) and tendon(s) at lower leg level, right leg, initial encounter: Secondary | ICD-10-CM | POA: Diagnosis not present

## 2021-09-29 MED ORDER — MELOXICAM 15 MG PO TABS: 15.0000 mg | ORAL_TABLET | Freq: Every day | ORAL | 0 refills | Status: DC

## 2021-09-29 NOTE — Telephone Encounter (Signed)
Prior authorization approved for Vyvanse. Effective: 08/30/21 - 09/29/22. Patient sent approval message via mychart. ?

## 2021-09-29 NOTE — Patient Instructions (Addendum)
Good to see you  ?Ankle HEP  ?Start meloxicam 15 mg daily x2 weeks.  If still having pain after 2 weeks, complete 3rd-week of meloxicam. May use remaining meloxicam as needed once daily for pain control.  Do not to use additional NSAIDs while taking meloxicam.  May use Tylenol [phone] 2 to 3 times a day for breakthrough pain. ?Recommend supportive tennis shoe wearing for 2 weeks ?As needed follow up  ? ? ?

## 2021-09-29 NOTE — Telephone Encounter (Signed)
Prior authorization approved for Wegovy 0.'25mg'$ /0.7m. Effective : 06/27/21 - 02/22/22. Patient sent approval message via mychart.  ?

## 2021-09-29 NOTE — Telephone Encounter (Addendum)
Patient sent mychart message previously in reference to [facility] for lower dose. Separate approval message sent for Vyvanse via mychart. ?

## 2021-09-29 NOTE — Telephone Encounter (Signed)
Patient is needing a prior authorization for the lower dose wegovy and prior authorization for vyvanse. ?

## 2021-10-07 DIAGNOSIS — M0579 Rheumatoid arthritis with rheumatoid factor of multiple sites without organ or systems involvement: Secondary | ICD-10-CM | POA: Diagnosis not present

## 2021-10-07 DIAGNOSIS — M79645 Pain in left finger(s): Secondary | ICD-10-CM | POA: Diagnosis not present

## 2021-10-07 DIAGNOSIS — M25551 Pain in right hip: Secondary | ICD-10-CM | POA: Diagnosis not present

## 2021-10-20 DIAGNOSIS — M0579 Rheumatoid arthritis with rheumatoid factor of multiple sites without organ or systems involvement: Secondary | ICD-10-CM | POA: Diagnosis not present

## 2021-11-02 DIAGNOSIS — R632 Polyphagia: Secondary | ICD-10-CM | POA: Diagnosis not present

## 2021-11-02 DIAGNOSIS — E669 Obesity, unspecified: Secondary | ICD-10-CM | POA: Diagnosis not present

## 2021-11-02 DIAGNOSIS — E890 Postprocedural hypothyroidism: Secondary | ICD-10-CM | POA: Diagnosis not present

## 2021-11-02 DIAGNOSIS — F5081 Binge eating disorder: Secondary | ICD-10-CM | POA: Diagnosis not present

## 2021-11-02 DIAGNOSIS — M0520 Rheumatoid vasculitis with rheumatoid arthritis of unspecified site: Secondary | ICD-10-CM | POA: Diagnosis not present

## 2021-12-01 DIAGNOSIS — E669 Obesity, unspecified: Secondary | ICD-10-CM | POA: Diagnosis not present

## 2021-12-01 DIAGNOSIS — E890 Postprocedural hypothyroidism: Secondary | ICD-10-CM | POA: Diagnosis not present

## 2021-12-01 DIAGNOSIS — R632 Polyphagia: Secondary | ICD-10-CM | POA: Diagnosis not present

## 2021-12-01 DIAGNOSIS — E8881 Metabolic syndrome: Secondary | ICD-10-CM | POA: Diagnosis not present

## 2021-12-16 DIAGNOSIS — M0579 Rheumatoid arthritis with rheumatoid factor of multiple sites without organ or systems involvement: Secondary | ICD-10-CM | POA: Diagnosis not present

## 2022-01-19 DIAGNOSIS — E890 Postprocedural hypothyroidism: Secondary | ICD-10-CM | POA: Diagnosis not present

## 2022-01-19 DIAGNOSIS — E8881 Metabolic syndrome: Secondary | ICD-10-CM | POA: Diagnosis not present

## 2022-01-19 DIAGNOSIS — R632 Polyphagia: Secondary | ICD-10-CM | POA: Diagnosis not present

## 2022-01-20 ENCOUNTER — Encounter (INDEPENDENT_AMBULATORY_CARE_PROVIDER_SITE_OTHER): Payer: Self-pay

## 2022-02-10 DIAGNOSIS — M0579 Rheumatoid arthritis with rheumatoid factor of multiple sites without organ or systems involvement: Secondary | ICD-10-CM | POA: Diagnosis not present

## 2022-02-10 DIAGNOSIS — Z79899 Other long term (current) drug therapy: Secondary | ICD-10-CM | POA: Diagnosis not present

## 2022-02-23 DIAGNOSIS — Z6830 Body mass index (BMI) 30.0-30.9, adult: Secondary | ICD-10-CM | POA: Diagnosis not present

## 2022-02-23 DIAGNOSIS — R632 Polyphagia: Secondary | ICD-10-CM | POA: Diagnosis not present

## 2022-03-29 DIAGNOSIS — R632 Polyphagia: Secondary | ICD-10-CM | POA: Diagnosis not present

## 2022-03-29 DIAGNOSIS — E890 Postprocedural hypothyroidism: Secondary | ICD-10-CM | POA: Diagnosis not present

## 2022-04-07 DIAGNOSIS — Z79899 Other long term (current) drug therapy: Secondary | ICD-10-CM | POA: Diagnosis not present

## 2022-04-07 DIAGNOSIS — M0579 Rheumatoid arthritis with rheumatoid factor of multiple sites without organ or systems involvement: Secondary | ICD-10-CM | POA: Diagnosis not present

## 2022-04-07 DIAGNOSIS — Z111 Encounter for screening for respiratory tuberculosis: Secondary | ICD-10-CM | POA: Diagnosis not present

## 2022-04-07 DIAGNOSIS — R5383 Other fatigue: Secondary | ICD-10-CM | POA: Diagnosis not present

## 2022-04-08 DIAGNOSIS — M7061 Trochanteric bursitis, right hip: Secondary | ICD-10-CM | POA: Diagnosis not present

## 2022-04-08 DIAGNOSIS — M25551 Pain in right hip: Secondary | ICD-10-CM | POA: Diagnosis not present

## 2022-04-08 DIAGNOSIS — M0579 Rheumatoid arthritis with rheumatoid factor of multiple sites without organ or systems involvement: Secondary | ICD-10-CM | POA: Diagnosis not present

## 2022-04-15 DIAGNOSIS — M25551 Pain in right hip: Secondary | ICD-10-CM | POA: Diagnosis not present

## 2022-04-15 DIAGNOSIS — M545 Low back pain, unspecified: Secondary | ICD-10-CM | POA: Diagnosis not present

## 2022-05-08 ENCOUNTER — Other Ambulatory Visit: Payer: Self-pay | Admitting: Sports Medicine

## 2022-05-10 DIAGNOSIS — Z8639 Personal history of other endocrine, nutritional and metabolic disease: Secondary | ICD-10-CM | POA: Diagnosis not present

## 2022-05-10 DIAGNOSIS — E663 Overweight: Secondary | ICD-10-CM | POA: Diagnosis not present

## 2022-05-10 DIAGNOSIS — F5081 Binge eating disorder: Secondary | ICD-10-CM | POA: Diagnosis not present

## 2022-05-10 DIAGNOSIS — Z6829 Body mass index (BMI) 29.0-29.9, adult: Secondary | ICD-10-CM | POA: Diagnosis not present

## 2022-05-11 ENCOUNTER — Other Ambulatory Visit (HOSPITAL_COMMUNITY): Payer: Self-pay

## 2022-05-11 MED ORDER — LISDEXAMFETAMINE DIMESYLATE 50 MG PO CAPS
50.0000 mg | ORAL_CAPSULE | Freq: Every morning | ORAL | 0 refills | Status: DC
  Filled 2022-05-11: qty 30, 30d supply, fill #0

## 2022-05-12 ENCOUNTER — Other Ambulatory Visit (HOSPITAL_COMMUNITY): Payer: Self-pay

## 2022-06-02 DIAGNOSIS — M0579 Rheumatoid arthritis with rheumatoid factor of multiple sites without organ or systems involvement: Secondary | ICD-10-CM | POA: Diagnosis not present

## 2022-06-08 ENCOUNTER — Other Ambulatory Visit (HOSPITAL_COMMUNITY): Payer: Self-pay

## 2022-06-08 MED ORDER — AMOXICILLIN 875 MG PO TABS
ORAL_TABLET | ORAL | 0 refills | Status: DC
  Filled 2022-06-08: qty 20, 10d supply, fill #0

## 2022-06-08 MED ORDER — PREDNISONE 10 MG PO TABS
ORAL_TABLET | ORAL | 0 refills | Status: DC
  Filled 2022-06-08: qty 21, 6d supply, fill #0

## 2022-06-15 ENCOUNTER — Other Ambulatory Visit (HOSPITAL_COMMUNITY): Payer: Self-pay

## 2022-06-15 MED ORDER — LISDEXAMFETAMINE DIMESYLATE 50 MG PO CAPS
50.0000 mg | ORAL_CAPSULE | Freq: Every morning | ORAL | 0 refills | Status: DC
  Filled 2022-06-15: qty 30, 30d supply, fill #0

## 2022-06-18 ENCOUNTER — Other Ambulatory Visit (HOSPITAL_COMMUNITY): Payer: Self-pay

## 2022-06-18 IMAGING — DX DG HIP (WITH OR WITHOUT PELVIS) 2-3V*R*
3 series · 3 of 3 positions shown · non-contrast
Comparison: None.

CLINICAL DATA: Chronic right hip pain without known injury.

EXAM:
DG HIP (WITH OR WITHOUT PELVIS) 2-3V RIGHT

[pelvis ap]
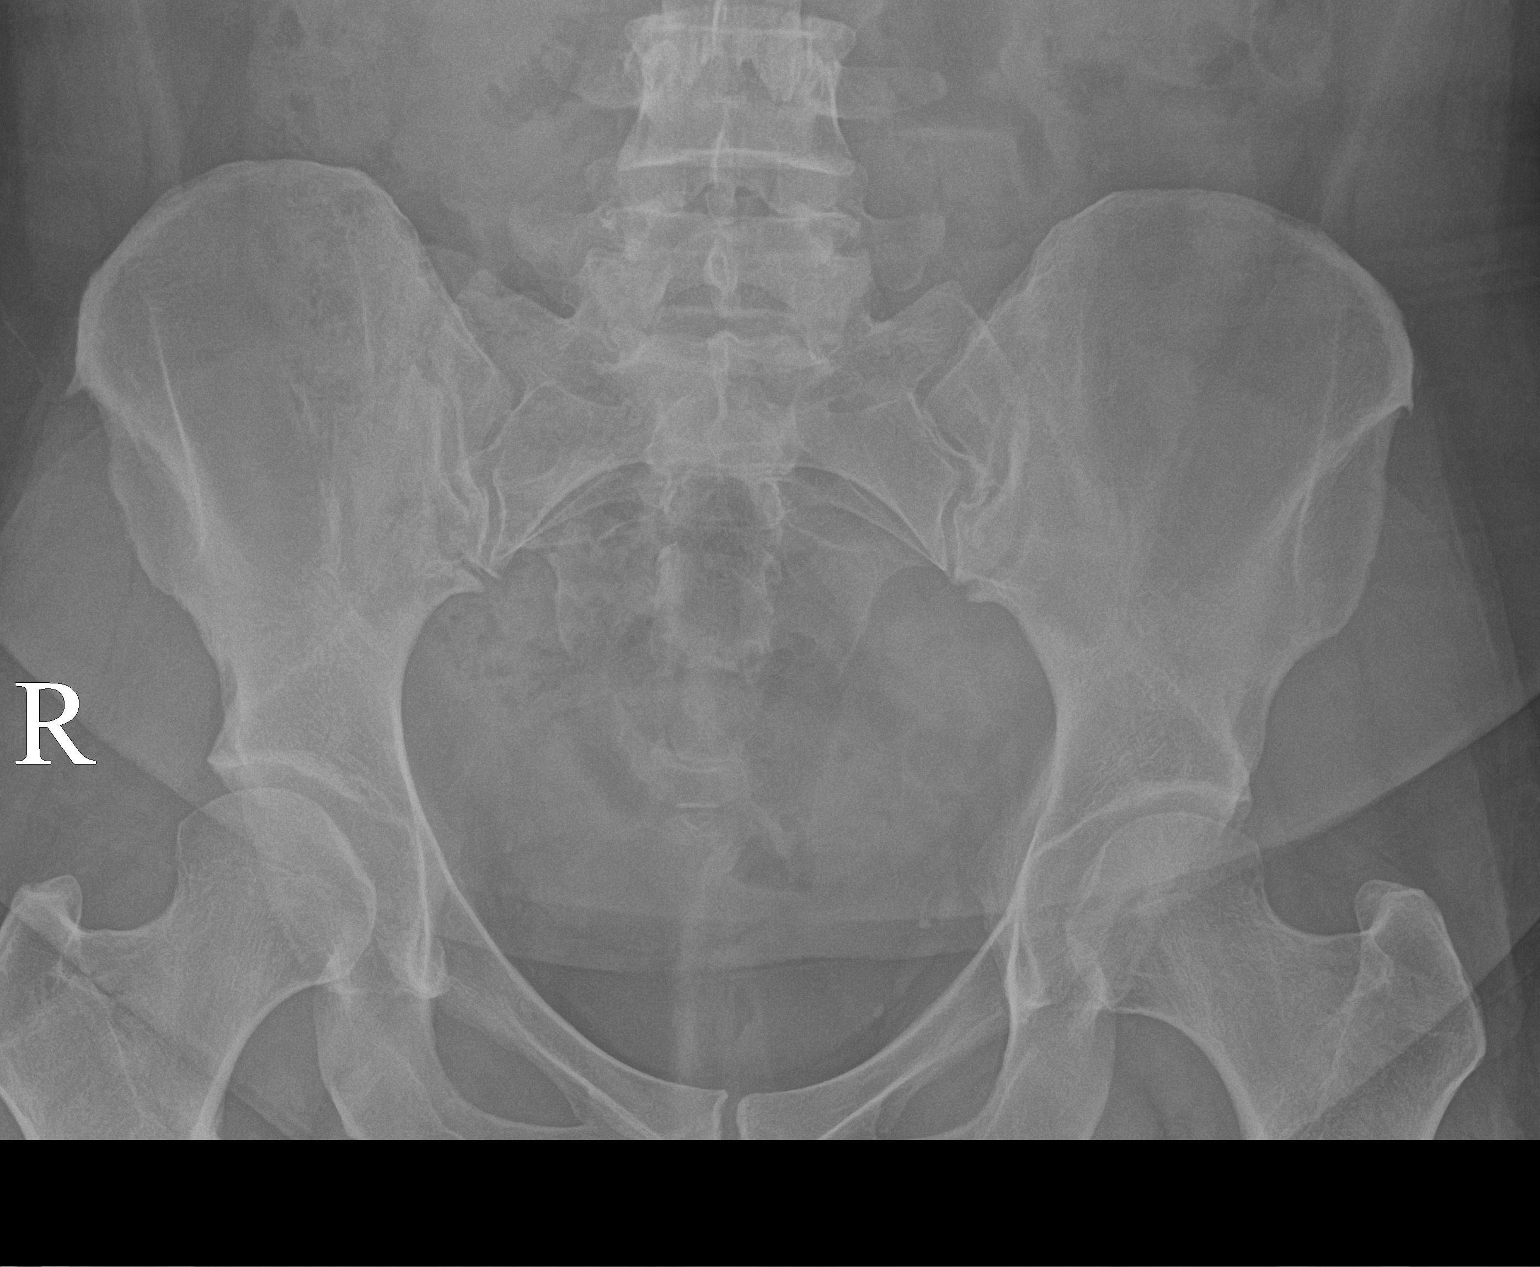

[hip ap]
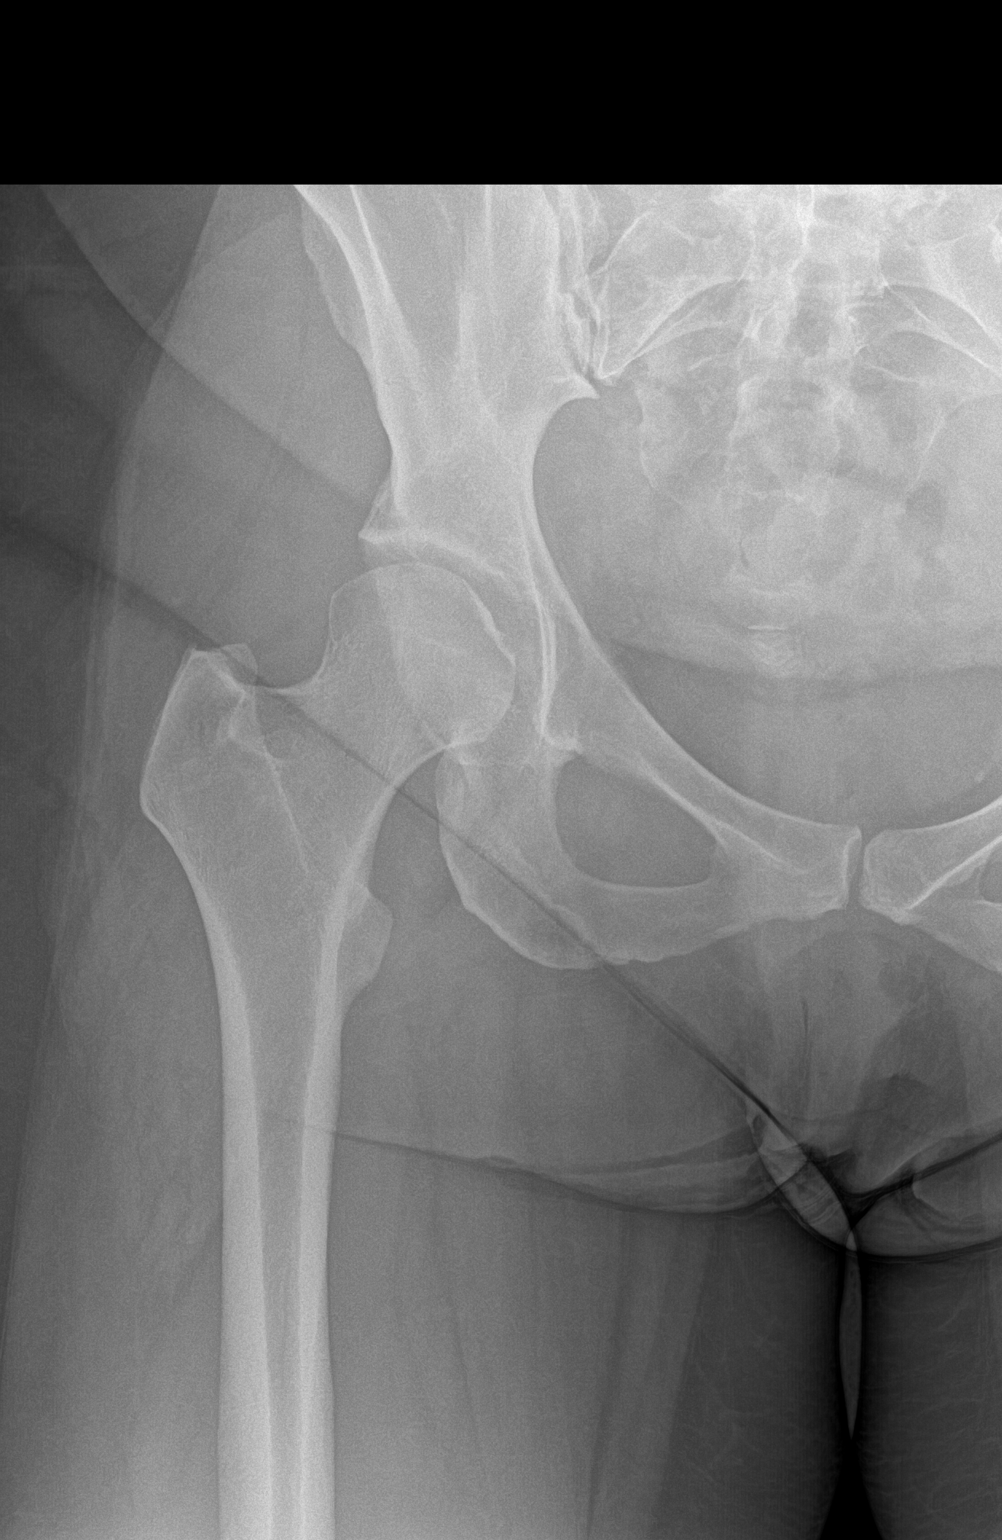

[hip frog leg]
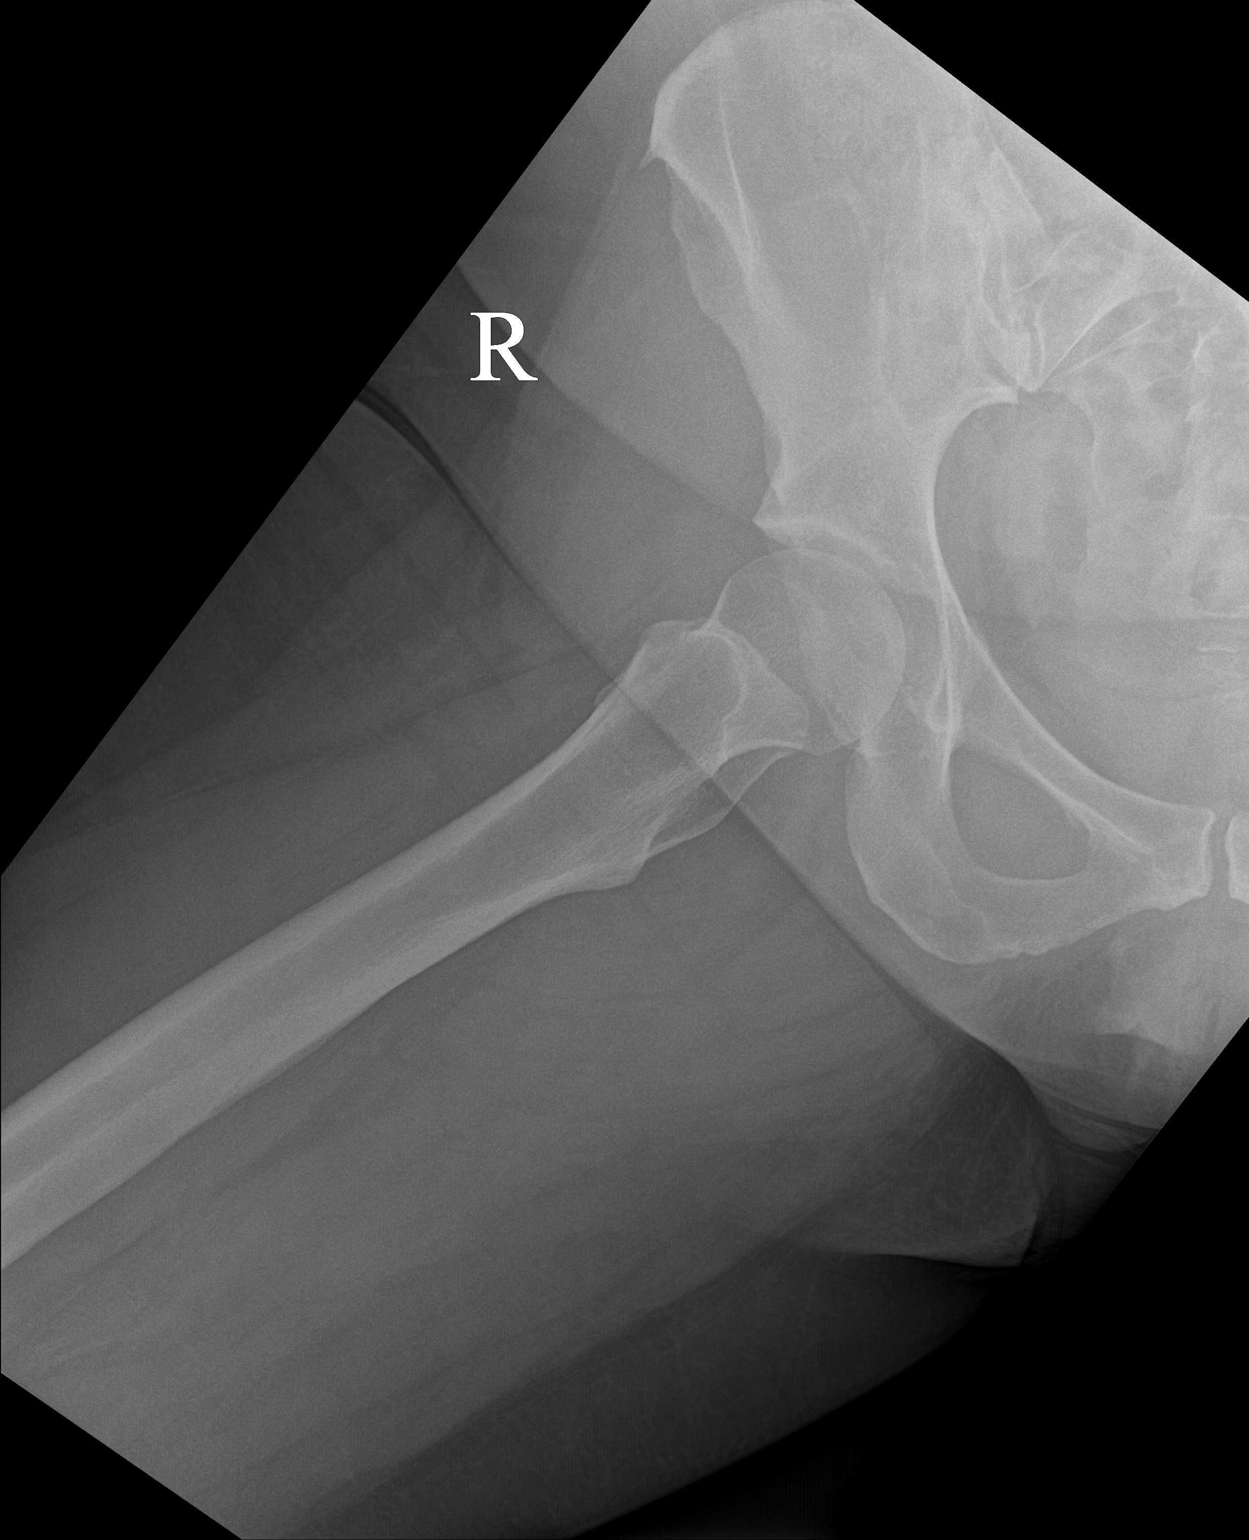

[3 of 3 positions shown; findings below may reference images not displayed]

FINDINGS: There is no evidence of hip fracture or dislocation. There is no
evidence of arthropathy or other focal bone abnormality.
IMPRESSION: Negative.

## 2022-06-23 DIAGNOSIS — E890 Postprocedural hypothyroidism: Secondary | ICD-10-CM | POA: Diagnosis not present

## 2022-06-23 DIAGNOSIS — E88819 Insulin resistance, unspecified: Secondary | ICD-10-CM | POA: Diagnosis not present

## 2022-06-23 DIAGNOSIS — Z6830 Body mass index (BMI) 30.0-30.9, adult: Secondary | ICD-10-CM | POA: Diagnosis not present

## 2022-06-23 DIAGNOSIS — F5081 Binge eating disorder: Secondary | ICD-10-CM | POA: Diagnosis not present

## 2022-06-30 DIAGNOSIS — Z1231 Encounter for screening mammogram for malignant neoplasm of breast: Secondary | ICD-10-CM | POA: Diagnosis not present

## 2022-07-15 ENCOUNTER — Other Ambulatory Visit (HOSPITAL_COMMUNITY): Payer: Self-pay

## 2022-07-16 ENCOUNTER — Other Ambulatory Visit (HOSPITAL_COMMUNITY): Payer: Self-pay

## 2022-07-16 MED ORDER — LISDEXAMFETAMINE DIMESYLATE 50 MG PO CHEW
1.0000 | CHEWABLE_TABLET | Freq: Every morning | ORAL | 0 refills | Status: DC
  Filled 2022-07-16: qty 30, 30d supply, fill #0

## 2022-07-19 ENCOUNTER — Other Ambulatory Visit (HOSPITAL_COMMUNITY): Payer: Self-pay

## 2022-07-21 ENCOUNTER — Other Ambulatory Visit (HOSPITAL_COMMUNITY): Payer: Self-pay

## 2022-07-21 DIAGNOSIS — E88819 Insulin resistance, unspecified: Secondary | ICD-10-CM | POA: Diagnosis not present

## 2022-07-21 DIAGNOSIS — Z8639 Personal history of other endocrine, nutritional and metabolic disease: Secondary | ICD-10-CM | POA: Diagnosis not present

## 2022-07-21 DIAGNOSIS — F5081 Binge eating disorder: Secondary | ICD-10-CM | POA: Diagnosis not present

## 2022-07-21 DIAGNOSIS — E890 Postprocedural hypothyroidism: Secondary | ICD-10-CM | POA: Diagnosis not present

## 2022-07-21 MED ORDER — ZEPBOUND 2.5 MG/0.5ML ~~LOC~~ SOAJ
2.5000 mg | SUBCUTANEOUS | 0 refills | Status: DC
  Filled 2022-07-21 – 2023-01-06 (×2): qty 2, 28d supply, fill #0

## 2022-07-22 ENCOUNTER — Other Ambulatory Visit (HOSPITAL_COMMUNITY): Payer: Self-pay

## 2022-07-28 DIAGNOSIS — M0579 Rheumatoid arthritis with rheumatoid factor of multiple sites without organ or systems involvement: Secondary | ICD-10-CM | POA: Diagnosis not present

## 2022-07-28 DIAGNOSIS — Z79899 Other long term (current) drug therapy: Secondary | ICD-10-CM | POA: Diagnosis not present

## 2022-08-16 ENCOUNTER — Other Ambulatory Visit (HOSPITAL_COMMUNITY): Payer: Self-pay

## 2022-08-16 MED ORDER — LISDEXAMFETAMINE DIMESYLATE 50 MG PO CHEW
50.0000 mg | CHEWABLE_TABLET | Freq: Every morning | ORAL | 0 refills | Status: DC
  Filled 2022-08-16: qty 30, 30d supply, fill #0

## 2022-08-20 IMAGING — DX DG ANKLE COMPLETE 3+V*R*
3 series · 3 of 3 positions shown · non-contrast
Comparison: None.

CLINICAL DATA: Right ankle pain x 2 days.

EXAM:
RIGHT ANKLE - COMPLETE 3+ VIEW

[ankle ap]
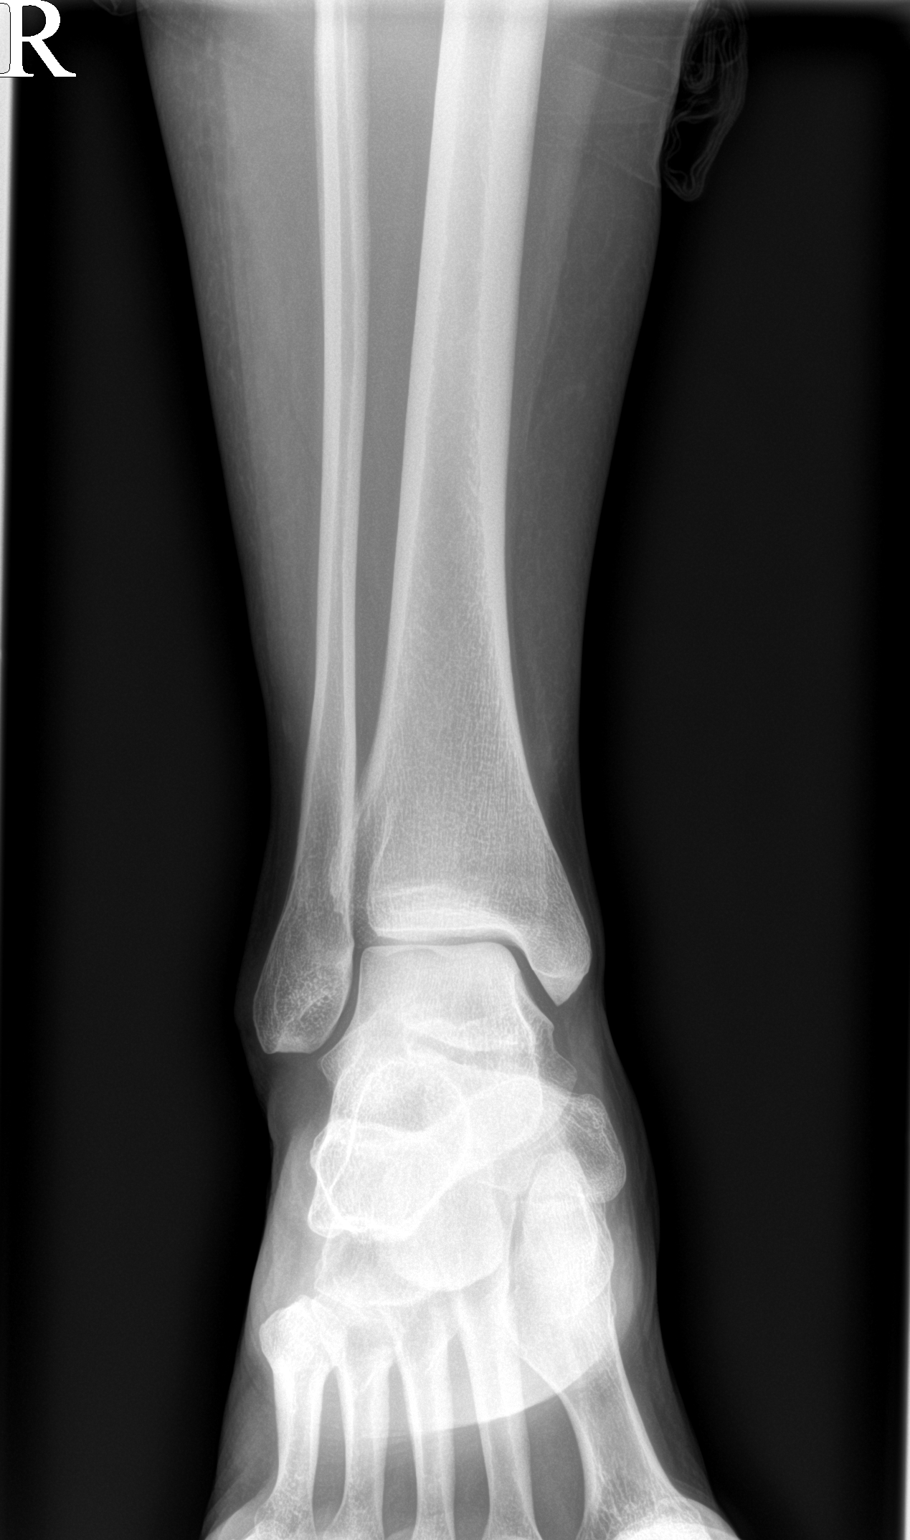

[ankle obl]
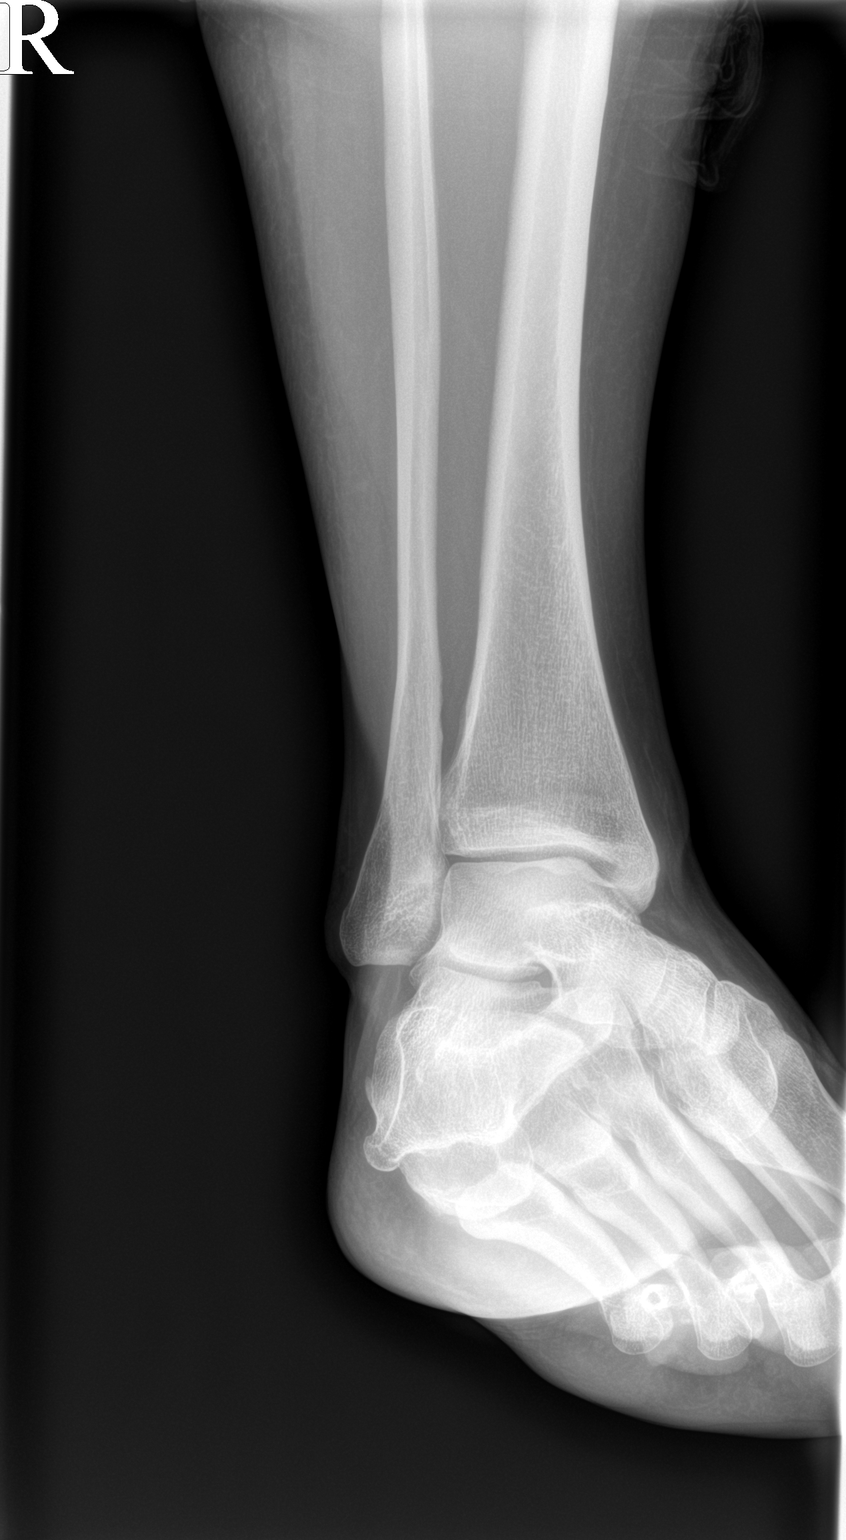

[ankle lat]
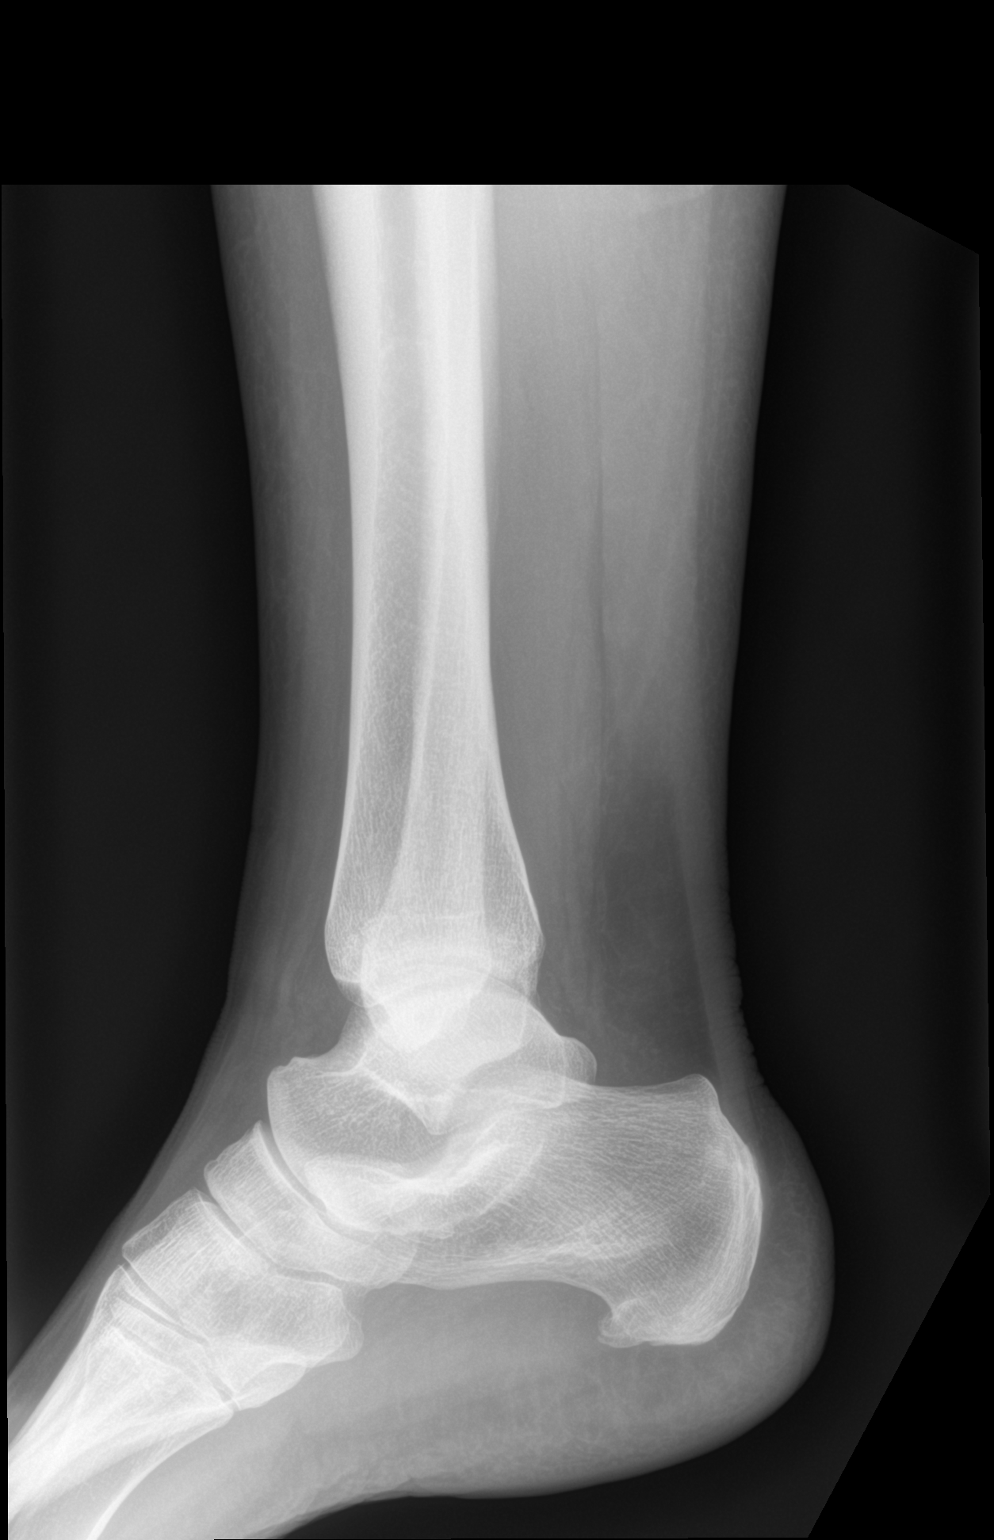

[3 of 3 positions shown; findings below may reference images not displayed]

FINDINGS: No fracture or bone lesion.

Ankle mortise is normally spaced and aligned. No arthropathic
changes.

Normal soft tissues.

Small plantar calcaneal spur.
IMPRESSION: 1. Small plantar calcaneal spur.  No other abnormality.

## 2022-08-23 ENCOUNTER — Other Ambulatory Visit (HOSPITAL_COMMUNITY): Payer: Self-pay

## 2022-08-23 DIAGNOSIS — F908 Attention-deficit hyperactivity disorder, other type: Secondary | ICD-10-CM | POA: Diagnosis not present

## 2022-08-23 DIAGNOSIS — Z8639 Personal history of other endocrine, nutritional and metabolic disease: Secondary | ICD-10-CM | POA: Diagnosis not present

## 2022-08-23 DIAGNOSIS — E039 Hypothyroidism, unspecified: Secondary | ICD-10-CM | POA: Diagnosis not present

## 2022-08-23 MED ORDER — LISDEXAMFETAMINE DIMESYLATE 50 MG PO CHEW
50.0000 mg | CHEWABLE_TABLET | Freq: Every morning | ORAL | 0 refills | Status: DC
  Filled 2022-10-05: qty 30, 30d supply, fill #0

## 2022-08-24 ENCOUNTER — Other Ambulatory Visit (HOSPITAL_COMMUNITY): Payer: Self-pay

## 2022-09-15 ENCOUNTER — Other Ambulatory Visit (HOSPITAL_COMMUNITY): Payer: Self-pay

## 2022-09-22 DIAGNOSIS — M0579 Rheumatoid arthritis with rheumatoid factor of multiple sites without organ or systems involvement: Secondary | ICD-10-CM | POA: Diagnosis not present

## 2022-10-04 ENCOUNTER — Other Ambulatory Visit (HOSPITAL_COMMUNITY): Payer: Self-pay

## 2022-10-04 MED ORDER — BUPROPION HCL ER (XL) 150 MG PO TB24
150.0000 mg | ORAL_TABLET | Freq: Every day | ORAL | 0 refills | Status: DC
  Filled 2022-10-04: qty 30, 30d supply, fill #0

## 2022-10-05 ENCOUNTER — Other Ambulatory Visit (HOSPITAL_COMMUNITY): Payer: Self-pay

## 2022-10-05 MED ORDER — LISDEXAMFETAMINE DIMESYLATE 50 MG PO CAPS
50.0000 mg | ORAL_CAPSULE | Freq: Every day | ORAL | 0 refills | Status: DC
  Filled 2022-10-05: qty 30, 30d supply, fill #0

## 2022-10-06 ENCOUNTER — Other Ambulatory Visit (HOSPITAL_COMMUNITY): Payer: Self-pay

## 2022-10-07 ENCOUNTER — Other Ambulatory Visit (HOSPITAL_COMMUNITY): Payer: Self-pay

## 2022-10-11 DIAGNOSIS — E038 Other specified hypothyroidism: Secondary | ICD-10-CM | POA: Diagnosis not present

## 2022-10-11 DIAGNOSIS — M25551 Pain in right hip: Secondary | ICD-10-CM | POA: Diagnosis not present

## 2022-10-11 DIAGNOSIS — M0579 Rheumatoid arthritis with rheumatoid factor of multiple sites without organ or systems involvement: Secondary | ICD-10-CM | POA: Diagnosis not present

## 2022-10-14 ENCOUNTER — Other Ambulatory Visit (HOSPITAL_COMMUNITY): Payer: Self-pay

## 2022-10-19 ENCOUNTER — Other Ambulatory Visit (HOSPITAL_COMMUNITY): Payer: Self-pay

## 2022-10-19 DIAGNOSIS — F908 Attention-deficit hyperactivity disorder, other type: Secondary | ICD-10-CM | POA: Diagnosis not present

## 2022-10-19 DIAGNOSIS — E663 Overweight: Secondary | ICD-10-CM | POA: Diagnosis not present

## 2022-10-19 MED ORDER — BUPROPION HCL ER (XL) 150 MG PO TB24
150.0000 mg | ORAL_TABLET | Freq: Every morning | ORAL | 0 refills | Status: DC
  Filled 2023-01-27: qty 30, 30d supply, fill #0

## 2022-10-19 MED ORDER — LISDEXAMFETAMINE DIMESYLATE 60 MG PO CHEW
CHEWABLE_TABLET | ORAL | 0 refills | Status: DC
  Filled 2022-10-19: qty 30, 30d supply, fill #0

## 2022-10-19 MED ORDER — BUPROPION HCL ER (XL) 150 MG PO TB24
150.0000 mg | ORAL_TABLET | Freq: Every day | ORAL | 0 refills | Status: DC
  Filled 2022-10-19 – 2023-04-04 (×4): qty 30, 30d supply, fill #0

## 2022-10-19 MED ORDER — BUPROPION HCL ER (XL) 150 MG PO TB24
150.0000 mg | ORAL_TABLET | Freq: Every morning | ORAL | 0 refills | Status: DC
  Filled 2022-10-19: qty 90, 90d supply, fill #0

## 2022-10-20 ENCOUNTER — Other Ambulatory Visit (HOSPITAL_COMMUNITY): Payer: Self-pay

## 2022-10-20 ENCOUNTER — Other Ambulatory Visit: Payer: Self-pay

## 2022-10-20 ENCOUNTER — Other Ambulatory Visit (HOSPITAL_BASED_OUTPATIENT_CLINIC_OR_DEPARTMENT_OTHER): Payer: Self-pay

## 2022-10-26 ENCOUNTER — Other Ambulatory Visit: Payer: Self-pay

## 2022-10-26 ENCOUNTER — Other Ambulatory Visit (HOSPITAL_COMMUNITY): Payer: Self-pay

## 2022-10-26 MED ORDER — LISDEXAMFETAMINE DIMESYLATE 30 MG PO CAPS
60.0000 mg | ORAL_CAPSULE | Freq: Every morning | ORAL | 0 refills | Status: DC
  Filled 2022-10-26: qty 60, 30d supply, fill #0

## 2022-11-09 ENCOUNTER — Other Ambulatory Visit (HOSPITAL_COMMUNITY): Payer: Self-pay

## 2022-11-09 MED ORDER — BUPROPION HCL ER (XL) 150 MG PO TB24
150.0000 mg | ORAL_TABLET | Freq: Every morning | ORAL | 3 refills | Status: DC
  Filled 2022-11-09 – 2023-01-27 (×3): qty 90, 90d supply, fill #0
  Filled 2023-05-03: qty 90, 90d supply, fill #1
  Filled 2023-07-28: qty 90, 90d supply, fill #2
  Filled 2023-11-04: qty 90, 90d supply, fill #3

## 2022-11-10 ENCOUNTER — Other Ambulatory Visit (HOSPITAL_COMMUNITY): Payer: Self-pay

## 2022-11-22 DIAGNOSIS — M0579 Rheumatoid arthritis with rheumatoid factor of multiple sites without organ or systems involvement: Secondary | ICD-10-CM | POA: Diagnosis not present

## 2022-11-22 DIAGNOSIS — E038 Other specified hypothyroidism: Secondary | ICD-10-CM | POA: Diagnosis not present

## 2022-11-30 ENCOUNTER — Other Ambulatory Visit (HOSPITAL_COMMUNITY): Payer: Self-pay

## 2022-12-06 DIAGNOSIS — E039 Hypothyroidism, unspecified: Secondary | ICD-10-CM | POA: Diagnosis not present

## 2022-12-06 DIAGNOSIS — F908 Attention-deficit hyperactivity disorder, other type: Secondary | ICD-10-CM | POA: Diagnosis not present

## 2022-12-06 DIAGNOSIS — Z8639 Personal history of other endocrine, nutritional and metabolic disease: Secondary | ICD-10-CM | POA: Diagnosis not present

## 2022-12-06 DIAGNOSIS — E88819 Insulin resistance, unspecified: Secondary | ICD-10-CM | POA: Diagnosis not present

## 2022-12-10 ENCOUNTER — Other Ambulatory Visit: Payer: Self-pay

## 2022-12-10 ENCOUNTER — Other Ambulatory Visit (HOSPITAL_COMMUNITY): Payer: Self-pay

## 2022-12-10 MED ORDER — LISDEXAMFETAMINE DIMESYLATE 60 MG PO CHEW
60.0000 mg | CHEWABLE_TABLET | Freq: Every morning | ORAL | 0 refills | Status: DC
  Filled 2022-12-10: qty 30, 30d supply, fill #0

## 2022-12-14 ENCOUNTER — Other Ambulatory Visit (HOSPITAL_COMMUNITY): Payer: Self-pay

## 2022-12-14 DIAGNOSIS — F4321 Adjustment disorder with depressed mood: Secondary | ICD-10-CM | POA: Diagnosis not present

## 2022-12-15 ENCOUNTER — Other Ambulatory Visit: Payer: Self-pay

## 2022-12-15 ENCOUNTER — Other Ambulatory Visit (HOSPITAL_COMMUNITY): Payer: Self-pay

## 2022-12-15 MED ORDER — LISDEXAMFETAMINE DIMESYLATE 30 MG PO CAPS
60.0000 mg | ORAL_CAPSULE | Freq: Every morning | ORAL | 0 refills | Status: DC
  Filled 2022-12-15: qty 60, 30d supply, fill #0

## 2022-12-17 ENCOUNTER — Other Ambulatory Visit (HOSPITAL_COMMUNITY): Payer: Self-pay

## 2022-12-17 ENCOUNTER — Other Ambulatory Visit: Payer: Self-pay

## 2022-12-22 ENCOUNTER — Other Ambulatory Visit (HOSPITAL_COMMUNITY): Payer: Self-pay

## 2023-01-03 ENCOUNTER — Other Ambulatory Visit (HOSPITAL_COMMUNITY): Payer: Self-pay

## 2023-01-03 DIAGNOSIS — F5081 Binge eating disorder: Secondary | ICD-10-CM | POA: Diagnosis not present

## 2023-01-03 DIAGNOSIS — Z8639 Personal history of other endocrine, nutritional and metabolic disease: Secondary | ICD-10-CM | POA: Diagnosis not present

## 2023-01-03 DIAGNOSIS — M069 Rheumatoid arthritis, unspecified: Secondary | ICD-10-CM | POA: Diagnosis not present

## 2023-01-03 DIAGNOSIS — E890 Postprocedural hypothyroidism: Secondary | ICD-10-CM | POA: Diagnosis not present

## 2023-01-03 MED ORDER — SYNTHROID 137 MCG PO TABS
137.0000 ug | ORAL_TABLET | Freq: Every morning | ORAL | 2 refills | Status: DC
  Filled 2023-01-03: qty 30, 30d supply, fill #0
  Filled 2023-01-27: qty 30, 30d supply, fill #1
  Filled 2023-03-02: qty 30, 30d supply, fill #2

## 2023-01-03 MED ORDER — PREDNISONE 5 MG PO TABS
ORAL_TABLET | ORAL | 0 refills | Status: DC
  Filled 2023-01-03: qty 21, 6d supply, fill #0

## 2023-01-04 ENCOUNTER — Other Ambulatory Visit (HOSPITAL_COMMUNITY): Payer: Self-pay

## 2023-01-04 MED ORDER — LISDEXAMFETAMINE DIMESYLATE 30 MG PO CAPS
60.0000 mg | ORAL_CAPSULE | Freq: Every morning | ORAL | 0 refills | Status: DC
  Filled 2023-01-06 – 2023-01-27 (×2): qty 60, 30d supply, fill #0

## 2023-01-06 ENCOUNTER — Other Ambulatory Visit (HOSPITAL_COMMUNITY): Payer: Self-pay

## 2023-01-11 ENCOUNTER — Other Ambulatory Visit: Payer: Self-pay

## 2023-01-17 DIAGNOSIS — Z79899 Other long term (current) drug therapy: Secondary | ICD-10-CM | POA: Diagnosis not present

## 2023-01-17 DIAGNOSIS — M0579 Rheumatoid arthritis with rheumatoid factor of multiple sites without organ or systems involvement: Secondary | ICD-10-CM | POA: Diagnosis not present

## 2023-01-22 ENCOUNTER — Other Ambulatory Visit (HOSPITAL_COMMUNITY): Payer: Self-pay

## 2023-01-27 ENCOUNTER — Other Ambulatory Visit: Payer: Self-pay

## 2023-01-27 ENCOUNTER — Other Ambulatory Visit (HOSPITAL_COMMUNITY): Payer: Self-pay

## 2023-01-27 MED ORDER — ZEPBOUND 2.5 MG/0.5ML ~~LOC~~ SOAJ
2.5000 mg | SUBCUTANEOUS | 0 refills | Status: DC
  Filled 2023-03-02: qty 6, 84d supply, fill #0

## 2023-01-28 ENCOUNTER — Other Ambulatory Visit (HOSPITAL_COMMUNITY): Payer: Self-pay

## 2023-01-28 MED ORDER — PREDNISONE 5 MG (21) PO TBPK
ORAL_TABLET | ORAL | 0 refills | Status: DC
  Filled 2023-01-28: qty 21, 6d supply, fill #0

## 2023-01-28 MED ORDER — LISDEXAMFETAMINE DIMESYLATE 30 MG PO CAPS
60.0000 mg | ORAL_CAPSULE | Freq: Every morning | ORAL | 0 refills | Status: DC
  Filled 2023-01-28 – 2023-04-04 (×2): qty 60, 30d supply, fill #0

## 2023-02-02 DIAGNOSIS — F5081 Binge eating disorder: Secondary | ICD-10-CM | POA: Diagnosis not present

## 2023-02-02 DIAGNOSIS — M069 Rheumatoid arthritis, unspecified: Secondary | ICD-10-CM | POA: Diagnosis not present

## 2023-02-02 DIAGNOSIS — E890 Postprocedural hypothyroidism: Secondary | ICD-10-CM | POA: Diagnosis not present

## 2023-02-21 ENCOUNTER — Other Ambulatory Visit (HOSPITAL_COMMUNITY): Payer: Self-pay

## 2023-02-21 MED ORDER — LISDEXAMFETAMINE DIMESYLATE 30 MG PO CAPS
60.0000 mg | ORAL_CAPSULE | Freq: Every morning | ORAL | 0 refills | Status: DC
  Filled 2023-03-02: qty 60, 30d supply, fill #0

## 2023-03-01 DIAGNOSIS — R632 Polyphagia: Secondary | ICD-10-CM | POA: Diagnosis not present

## 2023-03-01 DIAGNOSIS — E890 Postprocedural hypothyroidism: Secondary | ICD-10-CM | POA: Diagnosis not present

## 2023-03-01 DIAGNOSIS — F909 Attention-deficit hyperactivity disorder, unspecified type: Secondary | ICD-10-CM | POA: Diagnosis not present

## 2023-03-02 ENCOUNTER — Other Ambulatory Visit (HOSPITAL_COMMUNITY): Payer: Self-pay

## 2023-03-02 MED ORDER — WEGOVY 0.25 MG/0.5ML ~~LOC~~ SOAJ
0.2500 mg | SUBCUTANEOUS | 0 refills | Status: DC
Start: 2023-03-01 — End: 2023-07-06
  Filled 2023-03-02: qty 2, 28d supply, fill #0
  Filled 2023-06-13: qty 2, 28d supply, fill #1

## 2023-03-02 MED ORDER — WEGOVY 0.5 MG/0.5ML ~~LOC~~ SOAJ
0.5000 mg | SUBCUTANEOUS | 0 refills | Status: DC
Start: 2023-03-01 — End: 2023-07-06
  Filled 2023-03-02: qty 6, 84d supply, fill #0
  Filled 2023-04-04: qty 2, 28d supply, fill #0
  Filled 2023-05-03: qty 2, 28d supply, fill #1
  Filled 2023-06-13: qty 2, 28d supply, fill #2

## 2023-03-03 ENCOUNTER — Other Ambulatory Visit: Payer: Self-pay

## 2023-03-03 ENCOUNTER — Other Ambulatory Visit (HOSPITAL_COMMUNITY): Payer: Self-pay

## 2023-03-12 ENCOUNTER — Other Ambulatory Visit (HOSPITAL_COMMUNITY): Payer: Self-pay

## 2023-03-14 ENCOUNTER — Other Ambulatory Visit: Payer: Self-pay

## 2023-03-14 ENCOUNTER — Other Ambulatory Visit (HOSPITAL_COMMUNITY): Payer: Self-pay

## 2023-03-14 DIAGNOSIS — Z111 Encounter for screening for respiratory tuberculosis: Secondary | ICD-10-CM | POA: Diagnosis not present

## 2023-03-14 DIAGNOSIS — R5383 Other fatigue: Secondary | ICD-10-CM | POA: Diagnosis not present

## 2023-03-14 DIAGNOSIS — M0579 Rheumatoid arthritis with rheumatoid factor of multiple sites without organ or systems involvement: Secondary | ICD-10-CM | POA: Diagnosis not present

## 2023-04-04 ENCOUNTER — Other Ambulatory Visit (HOSPITAL_COMMUNITY): Payer: Self-pay

## 2023-04-05 ENCOUNTER — Other Ambulatory Visit (HOSPITAL_COMMUNITY): Payer: Self-pay

## 2023-04-05 ENCOUNTER — Other Ambulatory Visit: Payer: Self-pay

## 2023-04-06 ENCOUNTER — Other Ambulatory Visit (HOSPITAL_COMMUNITY): Payer: Self-pay

## 2023-04-06 DIAGNOSIS — M0579 Rheumatoid arthritis with rheumatoid factor of multiple sites without organ or systems involvement: Secondary | ICD-10-CM | POA: Diagnosis not present

## 2023-04-06 DIAGNOSIS — L659 Nonscarring hair loss, unspecified: Secondary | ICD-10-CM | POA: Diagnosis not present

## 2023-04-06 MED ORDER — SYNTHROID 137 MCG PO TABS
137.0000 ug | ORAL_TABLET | Freq: Every morning | ORAL | 2 refills | Status: DC
  Filled 2023-04-06: qty 30, 30d supply, fill #0
  Filled 2023-05-03: qty 30, 30d supply, fill #1
  Filled 2023-06-02 – 2023-11-04 (×2): qty 30, 30d supply, fill #2

## 2023-04-07 DIAGNOSIS — E66811 Obesity, class 1: Secondary | ICD-10-CM | POA: Diagnosis not present

## 2023-04-07 DIAGNOSIS — E890 Postprocedural hypothyroidism: Secondary | ICD-10-CM | POA: Diagnosis not present

## 2023-04-07 DIAGNOSIS — R632 Polyphagia: Secondary | ICD-10-CM | POA: Diagnosis not present

## 2023-04-07 DIAGNOSIS — F909 Attention-deficit hyperactivity disorder, unspecified type: Secondary | ICD-10-CM | POA: Diagnosis not present

## 2023-04-07 DIAGNOSIS — Z9189 Other specified personal risk factors, not elsewhere classified: Secondary | ICD-10-CM | POA: Diagnosis not present

## 2023-04-08 ENCOUNTER — Other Ambulatory Visit (HOSPITAL_COMMUNITY): Payer: Self-pay

## 2023-05-03 ENCOUNTER — Other Ambulatory Visit: Payer: Self-pay

## 2023-05-03 ENCOUNTER — Other Ambulatory Visit (HOSPITAL_COMMUNITY): Payer: Self-pay

## 2023-05-03 MED ORDER — LISDEXAMFETAMINE DIMESYLATE 30 MG PO CAPS
60.0000 mg | ORAL_CAPSULE | Freq: Every morning | ORAL | 0 refills | Status: DC
  Filled 2023-05-03 – 2023-09-19 (×4): qty 60, 30d supply, fill #0

## 2023-05-04 ENCOUNTER — Other Ambulatory Visit (HOSPITAL_COMMUNITY): Payer: Self-pay

## 2023-05-05 ENCOUNTER — Other Ambulatory Visit (HOSPITAL_COMMUNITY): Payer: Self-pay

## 2023-05-05 MED ORDER — WEGOVY 2.4 MG/0.75ML ~~LOC~~ SOAJ
SUBCUTANEOUS | 0 refills | Status: DC
  Filled 2023-05-05: qty 3, 30d supply, fill #0

## 2023-05-05 MED ORDER — LISDEXAMFETAMINE DIMESYLATE 30 MG PO CAPS
60.0000 mg | ORAL_CAPSULE | Freq: Every morning | ORAL | 0 refills | Status: DC
  Filled 2023-05-05: qty 60, 30d supply, fill #0

## 2023-05-05 MED ORDER — CIPROFLOXACIN-DEXAMETHASONE 0.3-0.1 % OT SUSP
4.0000 [drp] | Freq: Two times a day (BID) | OTIC | 1 refills | Status: DC
  Filled 2023-05-05: qty 7.5, 19d supply, fill #0
  Filled 2023-06-13: qty 7.5, 19d supply, fill #1

## 2023-05-09 DIAGNOSIS — M0579 Rheumatoid arthritis with rheumatoid factor of multiple sites without organ or systems involvement: Secondary | ICD-10-CM | POA: Diagnosis not present

## 2023-05-31 ENCOUNTER — Other Ambulatory Visit (HOSPITAL_COMMUNITY): Payer: Self-pay

## 2023-05-31 MED ORDER — AMOXICILLIN 875 MG PO TABS
875.0000 mg | ORAL_TABLET | Freq: Two times a day (BID) | ORAL | 0 refills | Status: DC
  Filled 2023-05-31: qty 20, 10d supply, fill #0

## 2023-06-02 ENCOUNTER — Other Ambulatory Visit (HOSPITAL_COMMUNITY): Payer: Self-pay

## 2023-06-02 ENCOUNTER — Other Ambulatory Visit: Payer: Self-pay

## 2023-06-02 DIAGNOSIS — F909 Attention-deficit hyperactivity disorder, unspecified type: Secondary | ICD-10-CM | POA: Diagnosis not present

## 2023-06-02 DIAGNOSIS — Z9189 Other specified personal risk factors, not elsewhere classified: Secondary | ICD-10-CM | POA: Diagnosis not present

## 2023-06-02 DIAGNOSIS — R632 Polyphagia: Secondary | ICD-10-CM | POA: Diagnosis not present

## 2023-06-02 DIAGNOSIS — E890 Postprocedural hypothyroidism: Secondary | ICD-10-CM | POA: Diagnosis not present

## 2023-06-02 MED ORDER — WEGOVY 2.4 MG/0.75ML ~~LOC~~ SOAJ
2.4000 mg | SUBCUTANEOUS | 0 refills | Status: DC
  Filled 2023-06-02: qty 3, 28d supply, fill #0

## 2023-06-04 ENCOUNTER — Other Ambulatory Visit (HOSPITAL_COMMUNITY): Payer: Self-pay

## 2023-06-04 MED ORDER — PREDNISONE 5 MG (21) PO TBPK
ORAL_TABLET | ORAL | 0 refills | Status: DC
  Filled 2023-06-04: qty 21, 6d supply, fill #0

## 2023-06-06 ENCOUNTER — Other Ambulatory Visit (HOSPITAL_COMMUNITY): Payer: Self-pay

## 2023-06-06 MED ORDER — SYNTHROID 137 MCG PO TABS
137.0000 ug | ORAL_TABLET | Freq: Every morning | ORAL | 0 refills | Status: DC
  Filled 2023-06-06 – 2023-06-13 (×2): qty 90, 90d supply, fill #0

## 2023-06-07 ENCOUNTER — Other Ambulatory Visit (HOSPITAL_COMMUNITY): Payer: Self-pay

## 2023-06-07 MED ORDER — LISDEXAMFETAMINE DIMESYLATE 30 MG PO CAPS
30.0000 mg | ORAL_CAPSULE | Freq: Every morning | ORAL | 0 refills | Status: DC
  Filled 2023-06-07: qty 60, 60d supply, fill #0

## 2023-06-11 ENCOUNTER — Other Ambulatory Visit (HOSPITAL_COMMUNITY): Payer: Self-pay

## 2023-06-13 ENCOUNTER — Other Ambulatory Visit (HOSPITAL_COMMUNITY): Payer: Self-pay

## 2023-06-13 ENCOUNTER — Other Ambulatory Visit: Payer: Self-pay

## 2023-06-14 ENCOUNTER — Other Ambulatory Visit (HOSPITAL_COMMUNITY): Payer: Self-pay

## 2023-06-14 ENCOUNTER — Other Ambulatory Visit: Payer: Self-pay

## 2023-06-14 MED ORDER — LISDEXAMFETAMINE DIMESYLATE 30 MG PO CAPS
60.0000 mg | ORAL_CAPSULE | Freq: Every morning | ORAL | 0 refills | Status: DC
  Filled 2023-06-14: qty 60, 30d supply, fill #0

## 2023-06-16 ENCOUNTER — Other Ambulatory Visit (HOSPITAL_COMMUNITY): Payer: Self-pay

## 2023-06-27 ENCOUNTER — Other Ambulatory Visit (HOSPITAL_COMMUNITY): Payer: Self-pay

## 2023-07-04 DIAGNOSIS — Z1231 Encounter for screening mammogram for malignant neoplasm of breast: Secondary | ICD-10-CM | POA: Diagnosis not present

## 2023-07-04 LAB — HM MAMMOGRAPHY

## 2023-07-05 DIAGNOSIS — Z79899 Other long term (current) drug therapy: Secondary | ICD-10-CM | POA: Diagnosis not present

## 2023-07-05 DIAGNOSIS — M0579 Rheumatoid arthritis with rheumatoid factor of multiple sites without organ or systems involvement: Secondary | ICD-10-CM | POA: Diagnosis not present

## 2023-07-06 ENCOUNTER — Other Ambulatory Visit (HOSPITAL_COMMUNITY): Payer: Self-pay

## 2023-07-06 DIAGNOSIS — F909 Attention-deficit hyperactivity disorder, unspecified type: Secondary | ICD-10-CM | POA: Diagnosis not present

## 2023-07-06 DIAGNOSIS — R632 Polyphagia: Secondary | ICD-10-CM | POA: Diagnosis not present

## 2023-07-06 DIAGNOSIS — G4709 Other insomnia: Secondary | ICD-10-CM | POA: Diagnosis not present

## 2023-07-06 MED ORDER — WEGOVY 2.4 MG/0.75ML ~~LOC~~ SOAJ
2.4000 mg | SUBCUTANEOUS | 1 refills | Status: DC
  Filled 2023-07-06 (×2): qty 3, 28d supply, fill #0
  Filled 2023-08-28: qty 3, 28d supply, fill #1

## 2023-07-06 MED ORDER — LISDEXAMFETAMINE DIMESYLATE 30 MG PO CAPS
60.0000 mg | ORAL_CAPSULE | Freq: Every morning | ORAL | 0 refills | Status: DC
  Filled 2023-07-06 – 2023-07-28 (×2): qty 60, 30d supply, fill #0

## 2023-07-07 ENCOUNTER — Other Ambulatory Visit (HOSPITAL_COMMUNITY): Payer: Self-pay

## 2023-07-26 DIAGNOSIS — F4321 Adjustment disorder with depressed mood: Secondary | ICD-10-CM | POA: Diagnosis not present

## 2023-07-28 ENCOUNTER — Other Ambulatory Visit: Payer: Self-pay

## 2023-07-28 ENCOUNTER — Other Ambulatory Visit (HOSPITAL_COMMUNITY): Payer: Self-pay

## 2023-08-02 ENCOUNTER — Other Ambulatory Visit (HOSPITAL_COMMUNITY): Payer: Self-pay

## 2023-08-02 ENCOUNTER — Other Ambulatory Visit (HOSPITAL_BASED_OUTPATIENT_CLINIC_OR_DEPARTMENT_OTHER): Payer: Self-pay

## 2023-08-02 DIAGNOSIS — E66811 Obesity, class 1: Secondary | ICD-10-CM | POA: Diagnosis not present

## 2023-08-02 DIAGNOSIS — R232 Flushing: Secondary | ICD-10-CM | POA: Diagnosis not present

## 2023-08-02 DIAGNOSIS — R632 Polyphagia: Secondary | ICD-10-CM | POA: Diagnosis not present

## 2023-08-02 DIAGNOSIS — F908 Attention-deficit hyperactivity disorder, other type: Secondary | ICD-10-CM | POA: Diagnosis not present

## 2023-08-02 MED ORDER — AMOXICILLIN 875 MG PO TABS
875.0000 mg | ORAL_TABLET | Freq: Two times a day (BID) | ORAL | 0 refills | Status: DC
  Filled 2023-08-02: qty 20, 10d supply, fill #0

## 2023-08-02 MED ORDER — PREDNISONE 5 MG (21) PO TBPK
ORAL_TABLET | ORAL | 0 refills | Status: DC
Start: 2023-08-02 — End: 2024-05-03
  Filled 2023-08-02: qty 21, 6d supply, fill #0

## 2023-08-03 ENCOUNTER — Other Ambulatory Visit (HOSPITAL_COMMUNITY): Payer: Self-pay

## 2023-08-03 MED ORDER — LISDEXAMFETAMINE DIMESYLATE 20 MG PO CAPS
20.0000 mg | ORAL_CAPSULE | Freq: Every day | ORAL | 0 refills | Status: DC
  Filled 2023-08-03 (×2): qty 15, 15d supply, fill #0

## 2023-08-03 MED ORDER — LISDEXAMFETAMINE DIMESYLATE 40 MG PO CAPS
40.0000 mg | ORAL_CAPSULE | Freq: Every morning | ORAL | 0 refills | Status: DC
  Filled 2023-08-03: qty 30, 30d supply, fill #0

## 2023-08-08 ENCOUNTER — Telehealth: Payer: Self-pay | Admitting: Genetic Counselor

## 2023-08-08 NOTE — Telephone Encounter (Signed)
 Informed patient to contact us to schedule appointments per Genetic Counseling referral received.

## 2023-08-18 ENCOUNTER — Telehealth: Payer: Self-pay | Admitting: Genetic Counselor

## 2023-08-18 NOTE — Telephone Encounter (Signed)
 Informed patient to contact us to schedule appointments per referral received.

## 2023-08-20 ENCOUNTER — Telehealth: Payer: Self-pay | Admitting: Genetic Counselor

## 2023-08-20 NOTE — Telephone Encounter (Signed)
 Left patient a voicemail in regards to scheduling future appointments with Genetics Counselor; left callback number for New Patients Specialist if wishing to be scheduled

## 2023-08-28 ENCOUNTER — Other Ambulatory Visit (HOSPITAL_COMMUNITY): Payer: Self-pay

## 2023-08-29 ENCOUNTER — Other Ambulatory Visit: Payer: Self-pay

## 2023-08-29 ENCOUNTER — Other Ambulatory Visit (HOSPITAL_COMMUNITY): Payer: Self-pay

## 2023-08-29 MED ORDER — LISDEXAMFETAMINE DIMESYLATE 40 MG PO CAPS
40.0000 mg | ORAL_CAPSULE | Freq: Every morning | ORAL | 0 refills | Status: DC
  Filled 2023-08-29 – 2023-09-19 (×4): qty 30, 30d supply, fill #0

## 2023-08-30 ENCOUNTER — Other Ambulatory Visit (HOSPITAL_COMMUNITY): Payer: Self-pay

## 2023-08-30 DIAGNOSIS — M0579 Rheumatoid arthritis with rheumatoid factor of multiple sites without organ or systems involvement: Secondary | ICD-10-CM | POA: Diagnosis not present

## 2023-08-30 MED ORDER — SYNTHROID 137 MCG PO TABS
137.0000 ug | ORAL_TABLET | Freq: Every morning | ORAL | 0 refills | Status: DC
Start: 2023-08-30 — End: 2023-12-19
  Filled 2023-08-30: qty 90, 90d supply, fill #0

## 2023-08-31 ENCOUNTER — Other Ambulatory Visit (HOSPITAL_COMMUNITY): Payer: Self-pay

## 2023-09-01 ENCOUNTER — Other Ambulatory Visit (HOSPITAL_COMMUNITY): Payer: Self-pay

## 2023-09-17 ENCOUNTER — Other Ambulatory Visit (HOSPITAL_COMMUNITY): Payer: Self-pay

## 2023-09-18 ENCOUNTER — Other Ambulatory Visit (HOSPITAL_COMMUNITY): Payer: Self-pay

## 2023-09-19 ENCOUNTER — Other Ambulatory Visit: Payer: Self-pay

## 2023-09-19 ENCOUNTER — Other Ambulatory Visit (HOSPITAL_COMMUNITY): Payer: Self-pay

## 2023-09-20 ENCOUNTER — Other Ambulatory Visit (HOSPITAL_COMMUNITY): Payer: Self-pay

## 2023-09-20 MED ORDER — AMOXICILLIN 875 MG PO TABS
875.0000 mg | ORAL_TABLET | Freq: Two times a day (BID) | ORAL | 0 refills | Status: AC
  Filled 2023-09-20: qty 20, 10d supply, fill #0

## 2023-09-20 MED ORDER — LISDEXAMFETAMINE DIMESYLATE 20 MG PO CAPS
20.0000 mg | ORAL_CAPSULE | Freq: Every day | ORAL | 0 refills | Status: DC
Start: 2023-09-20 — End: 2024-05-03
  Filled 2023-09-20 – 2023-11-04 (×2): qty 30, 30d supply, fill #0

## 2023-09-20 MED ORDER — LISDEXAMFETAMINE DIMESYLATE 20 MG PO CAPS
20.0000 mg | ORAL_CAPSULE | Freq: Every evening | ORAL | 0 refills | Status: DC
  Filled 2023-09-20 – 2023-11-04 (×2): qty 30, 30d supply, fill #0

## 2023-09-21 ENCOUNTER — Other Ambulatory Visit (HOSPITAL_COMMUNITY): Payer: Self-pay

## 2023-09-24 ENCOUNTER — Other Ambulatory Visit (HOSPITAL_COMMUNITY): Payer: Self-pay

## 2023-09-26 ENCOUNTER — Other Ambulatory Visit (HOSPITAL_COMMUNITY): Payer: Self-pay

## 2023-10-01 ENCOUNTER — Other Ambulatory Visit (HOSPITAL_COMMUNITY): Payer: Self-pay

## 2023-10-13 ENCOUNTER — Other Ambulatory Visit (HOSPITAL_COMMUNITY): Payer: Self-pay

## 2023-10-13 DIAGNOSIS — F908 Attention-deficit hyperactivity disorder, other type: Secondary | ICD-10-CM | POA: Diagnosis not present

## 2023-10-13 DIAGNOSIS — E6609 Other obesity due to excess calories: Secondary | ICD-10-CM | POA: Diagnosis not present

## 2023-10-13 DIAGNOSIS — E890 Postprocedural hypothyroidism: Secondary | ICD-10-CM | POA: Diagnosis not present

## 2023-10-13 DIAGNOSIS — R632 Polyphagia: Secondary | ICD-10-CM | POA: Diagnosis not present

## 2023-10-13 MED ORDER — LISDEXAMFETAMINE DIMESYLATE 30 MG PO CAPS
30.0000 mg | ORAL_CAPSULE | Freq: Two times a day (BID) | ORAL | 0 refills | Status: DC
  Filled 2023-10-13: qty 60, 30d supply, fill #0

## 2023-10-13 MED ORDER — SYNTHROID 137 MCG PO TABS
137.0000 ug | ORAL_TABLET | Freq: Every morning | ORAL | 1 refills | Status: DC
  Filled 2023-10-13 – 2023-12-15 (×2): qty 90, 90d supply, fill #0
  Filled 2024-03-13: qty 90, 90d supply, fill #1

## 2023-10-14 ENCOUNTER — Other Ambulatory Visit (HOSPITAL_COMMUNITY): Payer: Self-pay

## 2023-10-14 MED ORDER — AMOXICILLIN 875 MG PO TABS
875.0000 mg | ORAL_TABLET | Freq: Two times a day (BID) | ORAL | 0 refills | Status: DC
  Filled 2023-10-14: qty 20, 10d supply, fill #0

## 2023-10-19 DIAGNOSIS — F4321 Adjustment disorder with depressed mood: Secondary | ICD-10-CM | POA: Diagnosis not present

## 2023-10-24 ENCOUNTER — Other Ambulatory Visit (HOSPITAL_COMMUNITY): Payer: Self-pay

## 2023-10-27 DIAGNOSIS — M0579 Rheumatoid arthritis with rheumatoid factor of multiple sites without organ or systems involvement: Secondary | ICD-10-CM | POA: Diagnosis not present

## 2023-11-04 ENCOUNTER — Other Ambulatory Visit: Payer: Self-pay

## 2023-11-04 ENCOUNTER — Other Ambulatory Visit (HOSPITAL_COMMUNITY): Payer: Self-pay

## 2023-11-08 ENCOUNTER — Other Ambulatory Visit (HOSPITAL_COMMUNITY): Payer: Self-pay

## 2023-11-09 ENCOUNTER — Other Ambulatory Visit (HOSPITAL_COMMUNITY): Payer: Self-pay

## 2023-11-09 MED ORDER — WEGOVY 2.4 MG/0.75ML ~~LOC~~ SOAJ
2.4000 mg | SUBCUTANEOUS | 0 refills | Status: DC
  Filled 2023-11-09 – 2023-12-15 (×4): qty 3, 28d supply, fill #0

## 2023-11-15 ENCOUNTER — Other Ambulatory Visit (HOSPITAL_COMMUNITY): Payer: Self-pay

## 2023-11-15 DIAGNOSIS — F50819 Binge eating disorder, unspecified: Secondary | ICD-10-CM | POA: Diagnosis not present

## 2023-11-15 DIAGNOSIS — E66811 Obesity, class 1: Secondary | ICD-10-CM | POA: Diagnosis not present

## 2023-11-15 DIAGNOSIS — F909 Attention-deficit hyperactivity disorder, unspecified type: Secondary | ICD-10-CM | POA: Diagnosis not present

## 2023-11-15 DIAGNOSIS — Z6830 Body mass index (BMI) 30.0-30.9, adult: Secondary | ICD-10-CM | POA: Diagnosis not present

## 2023-11-15 MED ORDER — LISDEXAMFETAMINE DIMESYLATE 30 MG PO CAPS
60.0000 mg | ORAL_CAPSULE | Freq: Every morning | ORAL | 0 refills | Status: DC
  Filled 2023-11-15 – 2023-12-15 (×2): qty 60, 30d supply, fill #0

## 2023-11-21 DIAGNOSIS — R21 Rash and other nonspecific skin eruption: Secondary | ICD-10-CM | POA: Diagnosis not present

## 2023-11-21 DIAGNOSIS — M0579 Rheumatoid arthritis with rheumatoid factor of multiple sites without organ or systems involvement: Secondary | ICD-10-CM | POA: Diagnosis not present

## 2023-11-21 DIAGNOSIS — E038 Other specified hypothyroidism: Secondary | ICD-10-CM | POA: Diagnosis not present

## 2023-11-21 DIAGNOSIS — L659 Nonscarring hair loss, unspecified: Secondary | ICD-10-CM | POA: Diagnosis not present

## 2023-11-24 ENCOUNTER — Other Ambulatory Visit (HOSPITAL_COMMUNITY): Payer: Self-pay

## 2023-12-01 ENCOUNTER — Other Ambulatory Visit (HOSPITAL_COMMUNITY): Payer: Self-pay

## 2023-12-15 ENCOUNTER — Other Ambulatory Visit: Payer: Self-pay

## 2023-12-15 ENCOUNTER — Other Ambulatory Visit (HOSPITAL_COMMUNITY): Payer: Self-pay

## 2023-12-17 ENCOUNTER — Other Ambulatory Visit (HOSPITAL_COMMUNITY): Payer: Self-pay

## 2023-12-19 ENCOUNTER — Other Ambulatory Visit (HOSPITAL_COMMUNITY): Payer: Self-pay

## 2023-12-19 DIAGNOSIS — R632 Polyphagia: Secondary | ICD-10-CM | POA: Diagnosis not present

## 2023-12-19 DIAGNOSIS — F909 Attention-deficit hyperactivity disorder, unspecified type: Secondary | ICD-10-CM | POA: Diagnosis not present

## 2023-12-19 DIAGNOSIS — G4709 Other insomnia: Secondary | ICD-10-CM | POA: Diagnosis not present

## 2023-12-19 DIAGNOSIS — M0520 Rheumatoid vasculitis with rheumatoid arthritis of unspecified site: Secondary | ICD-10-CM | POA: Diagnosis not present

## 2023-12-19 MED ORDER — LEVOTHYROXINE SODIUM 137 MCG PO TABS: 137.0000 ug | ORAL_TABLET | Freq: Every morning | ORAL | 1 refills | Status: DC

## 2023-12-19 MED ORDER — BUPROPION HCL ER (XL) 150 MG PO TB24
150.0000 mg | ORAL_TABLET | Freq: Every morning | ORAL | 3 refills | Status: DC
  Filled 2024-01-23: qty 90, 90d supply, fill #0

## 2023-12-20 ENCOUNTER — Other Ambulatory Visit (HOSPITAL_COMMUNITY): Payer: Self-pay

## 2023-12-20 MED ORDER — LISDEXAMFETAMINE DIMESYLATE 30 MG PO CAPS
60.0000 mg | ORAL_CAPSULE | Freq: Every morning | ORAL | 0 refills | Status: DC
  Filled 2024-01-23: qty 60, 30d supply, fill #0

## 2023-12-20 MED ORDER — LISDEXAMFETAMINE DIMESYLATE 30 MG PO CAPS
60.0000 mg | ORAL_CAPSULE | Freq: Every day | ORAL | 0 refills | Status: DC
  Filled 2024-01-23 – 2024-04-11 (×2): qty 60, 30d supply, fill #0

## 2023-12-20 MED ORDER — ZOLPIDEM TARTRATE 5 MG PO TABS
5.0000 mg | ORAL_TABLET | Freq: Every day | ORAL | 0 refills | Status: DC
  Filled 2023-12-20: qty 12, 12d supply, fill #0

## 2023-12-20 MED ORDER — PREDNISONE 5 MG (21) PO TBPK
ORAL_TABLET | ORAL | 0 refills | Status: DC
  Filled 2023-12-20: qty 21, 6d supply, fill #0

## 2023-12-20 MED ORDER — LISDEXAMFETAMINE DIMESYLATE 30 MG PO CAPS
60.0000 mg | ORAL_CAPSULE | Freq: Every morning | ORAL | 0 refills | Status: DC
  Filled 2023-12-20: qty 60, 30d supply, fill #0

## 2023-12-26 DIAGNOSIS — M0579 Rheumatoid arthritis with rheumatoid factor of multiple sites without organ or systems involvement: Secondary | ICD-10-CM | POA: Diagnosis not present

## 2023-12-27 LAB — LAB REPORT - SCANNED: EGFR: 100

## 2024-01-03 ENCOUNTER — Other Ambulatory Visit (HOSPITAL_COMMUNITY): Payer: Self-pay

## 2024-01-23 ENCOUNTER — Other Ambulatory Visit (HOSPITAL_COMMUNITY): Payer: Self-pay

## 2024-01-24 ENCOUNTER — Other Ambulatory Visit (HOSPITAL_COMMUNITY): Payer: Self-pay

## 2024-01-24 ENCOUNTER — Other Ambulatory Visit: Payer: Self-pay

## 2024-01-24 DIAGNOSIS — E66811 Obesity, class 1: Secondary | ICD-10-CM | POA: Diagnosis not present

## 2024-01-24 DIAGNOSIS — F50819 Binge eating disorder, unspecified: Secondary | ICD-10-CM | POA: Diagnosis not present

## 2024-01-24 DIAGNOSIS — Z6830 Body mass index (BMI) 30.0-30.9, adult: Secondary | ICD-10-CM | POA: Diagnosis not present

## 2024-01-24 DIAGNOSIS — F909 Attention-deficit hyperactivity disorder, unspecified type: Secondary | ICD-10-CM | POA: Diagnosis not present

## 2024-01-24 DIAGNOSIS — E88819 Insulin resistance, unspecified: Secondary | ICD-10-CM | POA: Diagnosis not present

## 2024-01-24 MED ORDER — WEGOVY 2.4 MG/0.75ML ~~LOC~~ SOAJ
2.4000 mg | SUBCUTANEOUS | 1 refills | Status: DC
  Filled 2024-01-24: qty 3, 28d supply, fill #0
  Filled 2024-04-11: qty 3, 28d supply, fill #1

## 2024-01-24 MED ORDER — LISDEXAMFETAMINE DIMESYLATE 30 MG PO CAPS
60.0000 mg | ORAL_CAPSULE | Freq: Every morning | ORAL | 0 refills | Status: DC
  Filled 2024-01-24 – 2024-03-13 (×2): qty 60, 30d supply, fill #0

## 2024-02-20 DIAGNOSIS — E890 Postprocedural hypothyroidism: Secondary | ICD-10-CM | POA: Diagnosis not present

## 2024-02-20 DIAGNOSIS — G2581 Restless legs syndrome: Secondary | ICD-10-CM | POA: Diagnosis not present

## 2024-02-20 DIAGNOSIS — E559 Vitamin D deficiency, unspecified: Secondary | ICD-10-CM | POA: Diagnosis not present

## 2024-02-20 DIAGNOSIS — M0579 Rheumatoid arthritis with rheumatoid factor of multiple sites without organ or systems involvement: Secondary | ICD-10-CM | POA: Diagnosis not present

## 2024-02-20 DIAGNOSIS — E88819 Insulin resistance, unspecified: Secondary | ICD-10-CM | POA: Diagnosis not present

## 2024-02-20 DIAGNOSIS — N951 Menopausal and female climacteric states: Secondary | ICD-10-CM | POA: Diagnosis not present

## 2024-02-23 ENCOUNTER — Other Ambulatory Visit (HOSPITAL_BASED_OUTPATIENT_CLINIC_OR_DEPARTMENT_OTHER): Payer: Self-pay

## 2024-02-23 ENCOUNTER — Other Ambulatory Visit (HOSPITAL_COMMUNITY): Payer: Self-pay

## 2024-02-23 DIAGNOSIS — G4709 Other insomnia: Secondary | ICD-10-CM | POA: Diagnosis not present

## 2024-02-23 DIAGNOSIS — E890 Postprocedural hypothyroidism: Secondary | ICD-10-CM | POA: Diagnosis not present

## 2024-02-23 DIAGNOSIS — F50819 Binge eating disorder, unspecified: Secondary | ICD-10-CM | POA: Diagnosis not present

## 2024-02-23 DIAGNOSIS — F909 Attention-deficit hyperactivity disorder, unspecified type: Secondary | ICD-10-CM | POA: Diagnosis not present

## 2024-02-23 MED ORDER — ZOLPIDEM TARTRATE 5 MG PO TABS
5.0000 mg | ORAL_TABLET | Freq: Every evening | ORAL | 0 refills | Status: AC | PRN
  Filled 2024-02-23 – 2024-03-13 (×2): qty 30, 30d supply, fill #0

## 2024-02-23 MED ORDER — LISDEXAMFETAMINE DIMESYLATE 60 MG PO CAPS
60.0000 mg | ORAL_CAPSULE | Freq: Every morning | ORAL | 0 refills | Status: DC
  Filled 2024-02-23 – 2024-04-13 (×3): qty 30, 30d supply, fill #0

## 2024-02-23 MED ORDER — LEVOTHYROXINE SODIUM 137 MCG PO TABS
137.0000 ug | ORAL_TABLET | Freq: Every morning | ORAL | 1 refills | Status: DC
  Filled 2024-02-23: qty 90, 90d supply, fill #0

## 2024-02-23 MED ORDER — WEGOVY 2.4 MG/0.75ML ~~LOC~~ SOAJ
SUBCUTANEOUS | 1 refills | Status: DC
  Filled 2024-02-23: qty 3, 30d supply, fill #0
  Filled 2024-03-13 – 2024-03-16 (×2): qty 3, 28d supply, fill #0
  Filled 2024-05-08: qty 3, 28d supply, fill #1

## 2024-02-26 LAB — LAB REPORT - SCANNED: A1c: 5.1

## 2024-03-06 ENCOUNTER — Other Ambulatory Visit (HOSPITAL_COMMUNITY): Payer: Self-pay

## 2024-03-13 ENCOUNTER — Other Ambulatory Visit (HOSPITAL_COMMUNITY): Payer: Self-pay

## 2024-03-14 ENCOUNTER — Other Ambulatory Visit (HOSPITAL_COMMUNITY): Payer: Self-pay

## 2024-03-14 ENCOUNTER — Other Ambulatory Visit: Payer: Self-pay

## 2024-03-16 ENCOUNTER — Other Ambulatory Visit (HOSPITAL_COMMUNITY): Payer: Self-pay

## 2024-04-11 ENCOUNTER — Other Ambulatory Visit: Payer: Self-pay

## 2024-04-11 ENCOUNTER — Other Ambulatory Visit (HOSPITAL_COMMUNITY): Payer: Self-pay

## 2024-04-13 ENCOUNTER — Other Ambulatory Visit (HOSPITAL_COMMUNITY): Payer: Self-pay

## 2024-04-17 DIAGNOSIS — M0579 Rheumatoid arthritis with rheumatoid factor of multiple sites without organ or systems involvement: Secondary | ICD-10-CM | POA: Diagnosis not present

## 2024-04-17 DIAGNOSIS — R5383 Other fatigue: Secondary | ICD-10-CM | POA: Diagnosis not present

## 2024-04-19 LAB — LAB REPORT - SCANNED: HM Hepatitis Screen: NEGATIVE

## 2024-05-01 NOTE — Telephone Encounter (Signed)
 All attachments have been printed and will be scanned into pts chart.

## 2024-05-01 NOTE — Telephone Encounter (Signed)
 All attachments printed and will be scanned into pts chart.

## 2024-05-03 ENCOUNTER — Other Ambulatory Visit (HOSPITAL_COMMUNITY): Payer: Self-pay

## 2024-05-03 ENCOUNTER — Encounter: Payer: Self-pay | Admitting: Family Medicine

## 2024-05-03 ENCOUNTER — Ambulatory Visit (INDEPENDENT_AMBULATORY_CARE_PROVIDER_SITE_OTHER): Admitting: Family Medicine

## 2024-05-03 ENCOUNTER — Other Ambulatory Visit: Payer: Self-pay

## 2024-05-03 VITALS — BP 118/74 | HR 86 | Temp 97.9°F | Ht 66.0 in | Wt 190.2 lb

## 2024-05-03 DIAGNOSIS — M0579 Rheumatoid arthritis with rheumatoid factor of multiple sites without organ or systems involvement: Secondary | ICD-10-CM

## 2024-05-03 DIAGNOSIS — E890 Postprocedural hypothyroidism: Secondary | ICD-10-CM

## 2024-05-03 DIAGNOSIS — F5104 Psychophysiologic insomnia: Secondary | ICD-10-CM | POA: Diagnosis not present

## 2024-05-03 DIAGNOSIS — Z23 Encounter for immunization: Secondary | ICD-10-CM | POA: Diagnosis not present

## 2024-05-03 DIAGNOSIS — Z6830 Body mass index (BMI) 30.0-30.9, adult: Secondary | ICD-10-CM

## 2024-05-03 DIAGNOSIS — E66811 Obesity, class 1: Secondary | ICD-10-CM

## 2024-05-03 DIAGNOSIS — N951 Menopausal and female climacteric states: Secondary | ICD-10-CM

## 2024-05-03 DIAGNOSIS — F900 Attention-deficit hyperactivity disorder, predominantly inattentive type: Secondary | ICD-10-CM | POA: Diagnosis not present

## 2024-05-03 DIAGNOSIS — Z79899 Other long term (current) drug therapy: Secondary | ICD-10-CM

## 2024-05-03 LAB — T4, FREE: Free T4: 1.25 ng/dL (ref 0.60–1.60)

## 2024-05-03 LAB — TSH: TSH: 0.38 u[IU]/mL (ref 0.35–5.50)

## 2024-05-03 MED ORDER — BUPROPION HCL ER (XL) 150 MG PO TB24
150.0000 mg | ORAL_TABLET | Freq: Every morning | ORAL | 3 refills | Status: AC
  Filled 2024-05-03: qty 90, 90d supply, fill #0

## 2024-05-03 MED ORDER — LISDEXAMFETAMINE DIMESYLATE 60 MG PO CAPS
60.0000 mg | ORAL_CAPSULE | Freq: Every morning | ORAL | 0 refills | Status: DC
  Filled 2024-05-03 – 2024-05-23 (×3): qty 30, 30d supply, fill #0

## 2024-05-03 NOTE — Progress Notes (Signed)
 [facility] [address] [facility] [address] [place] [address] Dept: [phone] Dept Fax: [phone]  Assessment & Plan:   Postoperative hypothyroidism Course: Appears Euthyroid. No red-flag symptoms reported.  Labs: Last labs showed slight over-replacement. TSH 0.242 on 02/20/24. Labs through Rheumatologist, Dr. Mai.  Medication: Synthroid  137 mcg po daily.  Recheck TSH  FT4 today prior to deciding on changes since she appears euthyroid today. Counseling: Take with water on an empty stomach 60 minutes before breakfast OR consistently at bedtime >=3 hours after last meal; separate from Ca/Fe/Mg, antacids, bile acid sequestrants, and other interacting agents by several hours. Do not skip or double up doses without instructions.  Rheumatoid arthritis with rheumatoid factor of multiple sites without organ or systems involvement (HCC) Followed by Dr. Mai at [facility].  Medication: Golimumab  infusion q 8 weeks.  Monitoring: QuantGold and Hep panel negative (04/18/23). Notes: Izetta usually feels more pain and fatigue around 1-2 weeks prior to next infusion.   Attention deficit hyperactivity disorder (ADHD), predominantly inattentive type Functional status: stable.  Medication: Vyvanse  60 mg po daily. Side effects: None. PDMP without concerns. Screening:  Flowsheet Row Office Visit from 07/13/2018 in [facility] at [facility]  Thoughts that you would be better off dead, or of hurting yourself in some way Not at all  PHQ-9 Total Score 11    BP Readings from Last 3 Encounters:  05/03/24 118/74  09/29/21 120/72  09/14/21 123/81   Wt Readings from Last 3 Encounters:  05/03/24 190 lb 3.2 oz (86.3 kg)  09/29/21 197 lb (89.4 kg)  09/14/21 193 lb (87.5 kg)    Long term current use of therapeutic drug The patient is on the following medications that qualify as long-term therapeutic use and/or high-risk medications requiring  ongoing monitoring: Z79.1 NSAIDs  and Z79.53 immunosuppressants.   Class 1 obesity with serious comorbidity and body mass index (BMI) of 30.0 to 30.9 in adult Ready for action stage. Continue Wegovy  2.4 mg [place] weekly. Reviewed the importance of exercise and sleep management.  Orders Placed This Encounter  Procedures   Flu vaccine trivalent PF, 6mos and older(Flulaval,Afluria,Fluarix,Fluzone)   HPV 9-valent vaccine,Recombinat   TSH   T4, free   Meds ordered this encounter  Medications   buPROPion  (WELLBUTRIN  XL) 150 MG 24 hr tablet    Sig: Take 1 tablet (150 mg total) by mouth every morning.    Dispense:  90 tablet    Refill:  3   lisdexamfetamine (VYVANSE ) 60 MG capsule    Sig: Take 1 capsule (60 mg total) by mouth in the morning.    Dispense:  30 capsule    Refill:  0   Return in about 4 weeks (around 05/31/2024).  Geni Shutter, DO, MS, FAAFP, Dipl. ABOM  Subjective:   Patient is well known to me from my previous clinic and is now establishing care with me as their primary care provider.  All past medical history, surgical history, allergies, family history, immunizations andmedications were updated in the EMR today and reviewed under the history and medication portions of their EMR. Review of Systems: Negative, with the exception of above mentioned in HPI.  Current Outpatient Medications:    cholecalciferol (VITAMIN D3) 25 MCG (1000 UT) tablet, Take 3,000 Units by mouth daily. 3000 units daily, Disp: , Rfl:    Golimumab  50 MG/0.5ML SOAJ, Inject into the skin. 2 mg/kg  every 8 weeks infusion, Disp: , Rfl:    loratadine (CLARITIN) 10 MG  tablet, Take 10 mg by mouth daily., Disp: , Rfl:    Magnesium 200 MG TABS, Take 1 tablet by mouth daily., Disp: , Rfl:    Melatonin 5 MG TABS, Take 1 tablet by mouth daily., Disp: , Rfl:    Multiple Vitamins-Minerals (MULTIVITAMIN WITH MINERALS) tablet, Take 1 tablet by mouth daily., Disp: , Rfl:    semaglutide -weight management (WEGOVY ) 2.4  MG/0.75ML SOAJ SQ injection, Inject 2.4mg  under the skin once weekly., Disp: 3 mL, Rfl: 1   SYNTHROID  137 MCG tablet, Take 1 tablet (137 mcg total) by mouth every morning on an empty stomach., Disp: 30 tablet, Rfl: 2   zolpidem  (AMBIEN ) 5 MG tablet, Take 1 tablet (5 mg total) by mouth at bedtime as needed., Disp: 30 tablet, Rfl: 0   Biotin 5 MG CAPS, Take 1 capsule by mouth daily at 12 noon., Disp: , Rfl:    buPROPion  (WELLBUTRIN  XL) 150 MG 24 hr tablet, Take 1 tablet (150 mg total) by mouth every morning., Disp: 90 tablet, Rfl: 3   celecoxib (CELEBREX) 200 MG capsule, Take 200 mg by mouth 2 (two) times daily., Disp: , Rfl:    folic acid  (FOLVITE ) 1 MG tablet, Take 1 mg by mouth daily., Disp: , Rfl:    lisdexamfetamine (VYVANSE ) 60 MG capsule, Take 1 capsule (60 mg total) by mouth in the morning., Disp: 30 capsule, Rfl: 0   Objective:    VITALS: BP 118/74 (BP Location: Left Arm, Cuff Size: Normal)   Pulse 86   Temp 97.9 F (36.6 C) (Oral)   Ht 5' 6 (1.676 m)   Wt 190 lb 3.2 oz (86.3 kg)   SpO2 99%   BMI 30.70 kg/m   Wt Readings from Last 3 Encounters:  05/03/24 190 lb 3.2 oz (86.3 kg)  09/29/21 197 lb (89.4 kg)  09/14/21 193 lb (87.5 kg)   PHYSICAL EXAM: General: The patient is well-nourished, in no acute distress. The vital signs are documented above. Cardiac: There is a regular rate and rhythm.  Pulmonary: There is good air exchange bilaterally without wheezing or rales. Musculoskeletal: There are no major deformities or cyanosis. Neurologi: No focal weakness or paresthesias are detected. Psychiatric: The patient has a normal affect.  LABS: Note: Katie sent updated labs that were reviewed together. See MyChart messages.  Lab Results  Component Value Date   CREATININE 0.9 07/17/2020   BUN 10 07/17/2020   NA 139 07/17/2020   K 4.4 07/17/2020   CL 103 07/17/2020   CO2 19 07/17/2020   Lab Results  Component Value Date   ALT 15 07/17/2020   AST 19 07/17/2020   ALKPHOS  97 07/17/2020   BILITOT 0.3 06/25/2019   Lab Results  Component Value Date   HGBA1C 5.2 06/25/2019   HGBA1C 5.2 07/13/2018   Lab Results  Component Value Date   INSULIN  11.3 06/25/2019   INSULIN  15.4 07/13/2018   Lab Results  Component Value Date   TSH 0.38 05/03/2024   Lab Results  Component Value Date   CHOL 190 06/25/2019   HDL 46 06/25/2019   LDLCALC 133 (H) 06/25/2019   TRIG 57 06/25/2019   Lab Results  Component Value Date   VD25OH 37.5 06/25/2019   VD25OH 56.7 07/13/2018   Lab Results  Component Value Date   WBC 7.2 07/17/2020   HGB 13.8 07/17/2020   HCT 40 07/17/2020   MCV 88 07/13/2018   PLT 346 07/17/2020

## 2024-05-05 ENCOUNTER — Ambulatory Visit: Payer: Self-pay | Admitting: Family Medicine

## 2024-05-05 ENCOUNTER — Encounter: Payer: Self-pay | Admitting: Family Medicine

## 2024-05-05 DIAGNOSIS — F5104 Psychophysiologic insomnia: Secondary | ICD-10-CM | POA: Insufficient documentation

## 2024-05-05 DIAGNOSIS — N951 Menopausal and female climacteric states: Secondary | ICD-10-CM | POA: Insufficient documentation

## 2024-05-05 DIAGNOSIS — G2581 Restless legs syndrome: Secondary | ICD-10-CM | POA: Insufficient documentation

## 2024-05-05 DIAGNOSIS — R7689 Other specified abnormal immunological findings in serum: Secondary | ICD-10-CM | POA: Insufficient documentation

## 2024-05-05 DIAGNOSIS — E88819 Insulin resistance, unspecified: Secondary | ICD-10-CM | POA: Insufficient documentation

## 2024-05-05 DIAGNOSIS — E66811 Obesity, class 1: Secondary | ICD-10-CM | POA: Insufficient documentation

## 2024-05-05 DIAGNOSIS — F900 Attention-deficit hyperactivity disorder, predominantly inattentive type: Secondary | ICD-10-CM | POA: Insufficient documentation

## 2024-05-05 DIAGNOSIS — E559 Vitamin D deficiency, unspecified: Secondary | ICD-10-CM | POA: Insufficient documentation

## 2024-05-05 DIAGNOSIS — E890 Postprocedural hypothyroidism: Secondary | ICD-10-CM | POA: Insufficient documentation

## 2024-05-05 NOTE — Assessment & Plan Note (Addendum)
 The patient is on the following medications that qualify as long-term therapeutic use and/or high-risk medications requiring ongoing monitoring: Z79.1 NSAIDs  and Z79.53 immunosuppressants.

## 2024-05-05 NOTE — Assessment & Plan Note (Addendum)
 Functional status: stable.  Medication: Vyvanse  60 mg po daily. Side effects: None. PDMP without concerns. Screening:  Flowsheet Row Office Visit from 07/13/2018 in [facility] at [facility]  Thoughts that you would be better off dead, or of hurting yourself in some way Not at all  PHQ-9 Total Score 11    BP Readings from Last 3 Encounters:  05/03/24 118/74  09/29/21 120/72  09/14/21 123/81   Wt Readings from Last 3 Encounters:  05/03/24 190 lb 3.2 oz (86.3 kg)  09/29/21 197 lb (89.4 kg)  09/14/21 193 lb (87.5 kg)

## 2024-05-05 NOTE — Assessment & Plan Note (Addendum)
 Ready for action stage. Continue Wegovy  2.4 mg [place] weekly. Reviewed the importance of exercise and sleep management.

## 2024-05-05 NOTE — Assessment & Plan Note (Signed)
 Followed by Dr. Mai at [facility].  Medication: Golimumab  infusion q 8 weeks.  Monitoring: QuantGold and Hep panel negative (04/18/23). Notes: Briana Brown usually feels more pain and fatigue around 1-2 weeks prior to next infusion.

## 2024-05-05 NOTE — Assessment & Plan Note (Addendum)
 Course: Appears Euthyroid. No red-flag symptoms reported.  Labs: Last labs showed slight over-replacement. TSH 0.242 on 02/20/24. Labs through Rheumatologist, Dr. Mai.  Medication: Synthroid  137 mcg po daily.  Recheck TSH  FT4 today prior to deciding on changes since she appears euthyroid today. Counseling: Take with water on an empty stomach 60 minutes before breakfast OR consistently at bedtime >=3 hours after last meal; separate from Ca/Fe/Mg, antacids, bile acid sequestrants, and other interacting agents by several hours. Do not skip or double up doses without instructions.

## 2024-05-07 ENCOUNTER — Other Ambulatory Visit (HOSPITAL_COMMUNITY): Payer: Self-pay

## 2024-05-08 ENCOUNTER — Other Ambulatory Visit (HOSPITAL_COMMUNITY): Payer: Self-pay

## 2024-05-08 ENCOUNTER — Other Ambulatory Visit: Payer: Self-pay

## 2024-05-23 ENCOUNTER — Other Ambulatory Visit (HOSPITAL_COMMUNITY): Payer: Self-pay

## 2024-05-24 DIAGNOSIS — M0579 Rheumatoid arthritis with rheumatoid factor of multiple sites without organ or systems involvement: Secondary | ICD-10-CM | POA: Diagnosis not present

## 2024-05-24 DIAGNOSIS — R21 Rash and other nonspecific skin eruption: Secondary | ICD-10-CM | POA: Diagnosis not present

## 2024-06-04 ENCOUNTER — Ambulatory Visit (INDEPENDENT_AMBULATORY_CARE_PROVIDER_SITE_OTHER): Admitting: Family Medicine

## 2024-06-04 ENCOUNTER — Encounter: Payer: Self-pay | Admitting: Family Medicine

## 2024-06-04 ENCOUNTER — Telehealth (HOSPITAL_COMMUNITY): Payer: Self-pay

## 2024-06-04 ENCOUNTER — Other Ambulatory Visit (HOSPITAL_COMMUNITY): Payer: Self-pay

## 2024-06-04 VITALS — BP 116/74 | HR 96 | Ht 65.0 in | Wt 185.0 lb

## 2024-06-04 DIAGNOSIS — F5104 Psychophysiologic insomnia: Secondary | ICD-10-CM

## 2024-06-04 DIAGNOSIS — E890 Postprocedural hypothyroidism: Secondary | ICD-10-CM | POA: Diagnosis not present

## 2024-06-04 DIAGNOSIS — G2581 Restless legs syndrome: Secondary | ICD-10-CM | POA: Diagnosis not present

## 2024-06-04 DIAGNOSIS — E66811 Obesity, class 1: Secondary | ICD-10-CM | POA: Diagnosis not present

## 2024-06-04 DIAGNOSIS — Z6830 Body mass index (BMI) 30.0-30.9, adult: Secondary | ICD-10-CM | POA: Diagnosis not present

## 2024-06-04 DIAGNOSIS — R5383 Other fatigue: Secondary | ICD-10-CM | POA: Diagnosis not present

## 2024-06-04 DIAGNOSIS — N951 Menopausal and female climacteric states: Secondary | ICD-10-CM | POA: Diagnosis not present

## 2024-06-04 DIAGNOSIS — F900 Attention-deficit hyperactivity disorder, predominantly inattentive type: Secondary | ICD-10-CM | POA: Diagnosis not present

## 2024-06-04 MED ORDER — PREDNISONE 10 MG PO TABS
ORAL_TABLET | ORAL | 1 refills | Status: AC
  Filled 2024-06-04: qty 21, 6d supply, fill #0
  Filled 2024-06-10: qty 21, 6d supply, fill #1

## 2024-06-04 MED ORDER — WEGOVY 2.4 MG/0.75ML ~~LOC~~ SOAJ
SUBCUTANEOUS | 2 refills | Status: AC
  Filled 2024-06-04 – 2024-06-05 (×3): qty 3, 28d supply, fill #0
  Filled 2024-06-10 – 2024-07-17 (×4): qty 3, 28d supply, fill #1

## 2024-06-04 MED ORDER — SYNTHROID 137 MCG PO TABS
137.0000 ug | ORAL_TABLET | Freq: Every morning | ORAL | 3 refills | Status: AC
  Filled 2024-06-04: qty 90, 90d supply, fill #0

## 2024-06-04 MED ORDER — LISDEXAMFETAMINE DIMESYLATE 60 MG PO CAPS
60.0000 mg | ORAL_CAPSULE | Freq: Every morning | ORAL | 0 refills | Status: DC
  Filled 2024-06-04 – 2024-06-20 (×3): qty 30, 30d supply, fill #0

## 2024-06-04 NOTE — Progress Notes (Incomplete)
 "  Pushing infusion back - rx pred No energy Clarify I am obesity Try estrogen/prog combo - estrogen  Weight Management:   Starting weight: 213 lbs Starting date: 07/13/2018 Today's weight: 185 lbs Today's date: 06/04/2024 Total lbs lost to date: -28 lbs Total lbs lost since last in-office visit: -5 lbs Total weight loss percentage to date: -13.15% There is no height or weight on file to calculate BMI.   Nutrition Plan: ***. Anti-obesity medications: Wegovy  2.4mg ; Vyvanse  60mg . Reported side effects: None. Hunger is {EWCONTROLASSESSMENT:24261}. Cravings are {EWCONTROLASSESSMENT:24261}. Activity: *** Sleep: Number of hours slept each night: ***. Sleep {ACTION; IS/IS NOT:21021397} restful.   Subjective:   History of Present Illness Briana Brown is a 43 year old female who presents with sleep disturbances and low energy.  Sleep disturbance and fatigue - Sleep disturbances characterized by restless leg symptoms, occasional night sweats, and persistent low energy - Feels exhausted upon waking despite generally sleeping through the night - Fatigue prevents her from getting up early with her children; husband handles morning practices - Uses Ambien  3 to 4 nights per week with inconsistent benefit - Experiences feeling like a zombie on waking after Ambien  use and sometimes wakes in the middle of the night after taking it  Restless leg symptoms - Restless leg symptoms present, contributing to sleep disturbance  Energy levels and activity tolerance - Persistent low energy and fatigue - Feels energized during activity but experiences post-activity fatigue, especially after busy weekends - Recovers at home after periods of increased activity - Spends 6 to 8 hours a day on her feet at work - Has not resumed prior structured exercise such as walking or weight training  Thyroid  cancer and hormonal status - History of thyroid  cancer treated with Synthroid , taken  consistently - Recent thyroid  labs: T4 1.25 - FSH between 30 and 38  Medication use - Takes Synthroid  for thyroid  cancer - Takes Wellbutrin  and Vyvanse  60 mg each morning - Uses Ambien  3 to 4 nights per week for sleep disturbances  Nutritional status and weight changes - Focusing on protein intake - Has lost a couple of pounds since last visit  Breast health and cancer risk - Family history of breast cancer in mother, grandmother, aunt, and great-grandmother; mother had estrogen-positive breast cancer - Genetic testing for breast cancer genes negative for both her and her mother - Dense breasts, undergoes annual mammograms - Prior benign left breast biopsy  Review of Systems: Negative, with the exception of above mentioned in HPI.  History:   Reviewed by clinician on day of visit: allergies, medications, problem list, medical history, surgical history, family history, social history, and previous encounter notes.  Medications:   Show/hide medication list[1] Allergies[2]  Objective:   There were no vitals taken for this visit.  Physical Exam  Results for orders placed or performed in visit on 06/01/24  HM PAP SMEAR   Collection Time: 05/12/20  8:43 AM  Result Value Ref Range   HM Pap smear NILM   HM MAMMOGRAPHY   Collection Time: 07/04/23  8:46 AM  Result Value Ref Range   HM Mammogram 0-4 Bi-Rad 0-4 Bi-Rad, Self Reported Normal     Diagnoses and Orders:   No diagnosis found. No orders of the defined types were placed in this encounter.  No orders of the defined types were placed in this encounter.   Assessment & Plan:   Assessment and Plan Assessment & Plan Woman's Wellness Visit Discussed family history of breast cancer, dense  breast tissue, and benign biopsy. Reviewed HRT risks and alternative treatments for menopausal symptoms. - Continue annual mammograms. - Consider trial of estrogen, progesterone , and testosterone for menopausal symptoms. - Follow  up on genetic testing for breast cancer risk. - Consider breast MRI every six months if HRT is initiated.  Menopausal symptoms Symptoms include low energy, restless leg syndrome, and night sweats. Current Ambien  use not providing restorative sleep. Discussed HRT and alternative treatments. - Discontinued Ambien . - Consider trial of estrogen, progesterone , and testosterone for symptom management. - Discuss alternative medications for hot flashes and vaginal dryness.  Postoperative hypothyroidism Managed with Synthroid . Recent labs reviewed, no missed doses. - Continue Synthroid  137 mcg daily.  Attention deficit hyperactivity disorder, predominantly inattentive type Managed with Vyvanse . - Continue Vyvanse  60 mg daily.  Class 1 obesity with serious comorbidity and body mass index (BMI) of 30.0 to 30.9 in adult Weight management with Semaglutide . Discussed insurance requirements. Engaging in physical activity and dietary modifications. - Continue Semaglutide  (Wegovy ) 2.4 mg weekly. - Follow up on insurance requirements for weight management program. - Encouraged continued physical activity and dietary modifications.     Geni Shutter, DO, MS, FAAFP, Dipl. [facility] [address] [place], [address] Dept: [phone] Dept Fax: [phone]  Attestations:   {EW ATTESTATIONS:34266} {ACUTE VISIT ATTESTATION:34267} {EW MEDICARE SCREENING AND COUNSELING:34331}     [1]  Outpatient Medications Prior to Visit  Medication Sig   Biotin 5 MG CAPS Take 1 capsule by mouth daily at 12 noon.   buPROPion  (WELLBUTRIN  XL) 150 MG 24 hr tablet Take 1 tablet (150 mg total) by mouth every morning.   celecoxib (CELEBREX) 200 MG capsule Take 200 mg by mouth 2 (two) times daily.   cholecalciferol (VITAMIN D3) 25 MCG (1000 UT) tablet Take 3,000 Units by mouth daily. 3000 units daily   folic acid  (FOLVITE ) 1 MG tablet Take 1 mg by mouth daily.    Golimumab  50 MG/0.5ML SOAJ Inject into the skin. 2 mg/kg  every 8 weeks infusion   lisdexamfetamine  (VYVANSE ) 60 MG capsule Take 1 capsule (60 mg total) by mouth in the morning.   loratadine (CLARITIN) 10 MG tablet Take 10 mg by mouth daily.   Magnesium 200 MG TABS Take 1 tablet by mouth daily.   Melatonin 5 MG TABS Take 1 tablet by mouth daily.   Multiple Vitamins-Minerals (MULTIVITAMIN WITH MINERALS) tablet Take 1 tablet by mouth daily.   semaglutide -weight management (WEGOVY ) 2.4 MG/0.75ML SOAJ SQ injection Inject 2.4mg  under the skin once weekly.   SYNTHROID  137 MCG tablet Take 1 tablet (137 mcg total) by mouth every morning on an empty stomach.   zolpidem  (AMBIEN ) 5 MG tablet Take 1 tablet (5 mg total) by mouth at bedtime as needed.   No facility-administered medications prior to visit.  [2]  Allergies Allergen Reactions   Adalimumab     Other Reaction(s): migraines   Shellfish Allergy Nausea And Vomiting   Soy Allergy (Obsolete)    Sulfa Antibiotics Nausea And Vomiting   Allevyn Adhesive [Wound Dressings] Rash   "

## 2024-06-05 ENCOUNTER — Telehealth (HOSPITAL_COMMUNITY): Payer: Self-pay

## 2024-06-05 ENCOUNTER — Other Ambulatory Visit (HOSPITAL_COMMUNITY): Payer: Self-pay

## 2024-06-05 NOTE — Telephone Encounter (Signed)
 PA request has been Received. New Encounter has been or will be created for follow up. For additional info see Pharmacy Prior Auth telephone encounter from 06/05/24.

## 2024-06-05 NOTE — Telephone Encounter (Signed)
 Pharmacy Patient Advocate Encounter  Received notification from [employer] that Prior Authorization for Wegovy  2.4MG /0.75ML auto-injectors  has been APPROVED from 05/06/24 to 06/05/25   PA #/Case ID/Reference #: [id number]  *When trying to process it still says PA req. Spoke with insurance and they said her plan termed on 06/14/23. Spoke with patient and she is going to get with her husband and call me back.

## 2024-06-06 ENCOUNTER — Other Ambulatory Visit (HOSPITAL_COMMUNITY): Payer: Self-pay

## 2024-06-06 NOTE — Telephone Encounter (Signed)
 Please see message from pt.

## 2024-06-07 MED ORDER — ESTRADIOL 0.025 MG/24HR TD PTWK
0.0250 mg | MEDICATED_PATCH | TRANSDERMAL | 12 refills | Status: DC
  Filled 2024-06-07: qty 4, 28d supply, fill #0
  Filled 2024-07-17: qty 4, 28d supply, fill #1

## 2024-06-07 MED ORDER — PROGESTERONE MICRONIZED 100 MG PO CAPS
100.0000 mg | ORAL_CAPSULE | Freq: Every day | ORAL | 3 refills | Status: AC
  Filled 2024-06-07 – 2024-06-11 (×2): qty 30, 30d supply, fill #0

## 2024-06-08 ENCOUNTER — Other Ambulatory Visit (HOSPITAL_COMMUNITY): Payer: Self-pay

## 2024-06-09 ENCOUNTER — Other Ambulatory Visit (HOSPITAL_COMMUNITY): Payer: Self-pay

## 2024-06-10 ENCOUNTER — Other Ambulatory Visit (HOSPITAL_COMMUNITY): Payer: Self-pay

## 2024-06-11 ENCOUNTER — Other Ambulatory Visit (HOSPITAL_COMMUNITY): Payer: Self-pay

## 2024-06-11 ENCOUNTER — Other Ambulatory Visit: Payer: Self-pay

## 2024-06-11 NOTE — Progress Notes (Signed)
 "   Weight Management:   Starting weight: 213 lbs Starting date: 07/13/2018 Today's weight: 185 lbs Today's date: 06/11/2024 Total lbs lost to date: -28 lbs Total lbs lost since last in-office visit: -5 lbs Total weight loss percentage to date: -13.15% Body mass index is 30.79 kg/m.   Subjective:   History of Present Illness Briana Brown is a 43 year old female who presents with sleep disturbances and low energy.  Sleep disturbance and fatigue - Sleep disturbances characterized by restless leg symptoms, occasional night sweats, and persistent low energy - Feels exhausted upon waking despite generally sleeping through the night - Fatigue prevents her from getting up early with her children; husband handles morning practices - Uses Ambien  3 to 4 nights per week with inconsistent benefit - Experiences feeling like a zombie on waking after Ambien  use and sometimes wakes in the middle of the night after taking it  Restless leg symptoms - Restless leg symptoms present, contributing to sleep disturbance  Energy levels and activity tolerance - Persistent low energy and fatigue - Feels energized during activity but experiences post-activity fatigue, especially after busy weekends - Recovers at home after periods of increased activity - Spends 6 to 8 hours a day on her feet at work - Has not resumed prior structured exercise such as walking or weight training  Thyroid  cancer and hormonal status - History of thyroid  cancer treated with Synthroid , taken consistently - Recent thyroid  labs: T4 1.25 - FSH between 30 and 38  Medication use - Takes Synthroid  for thyroid  cancer - Takes Wellbutrin  and Vyvanse  60 mg each morning - Uses Ambien  3 to 4 nights per week for sleep disturbances  Nutritional status and weight changes - Focusing on protein intake - Has lost a couple of pounds since last visit  Breast health and cancer risk - Family history of breast cancer in mother,  grandmother, aunt, and great-grandmother; mother had estrogen-positive breast cancer - Genetic testing for breast cancer genes negative for both her and her mother - Dense breasts, undergoes annual mammograms - Prior benign left breast biopsy  Review of Systems: Negative, with the exception of above mentioned in HPI.  History:   Reviewed by clinician on day of visit: allergies, medications, problem list, medical history, surgical history, family history, social history, and previous encounter notes.  Medications:   Show/hide medication list[1] Allergies[2]  Objective:   BP 116/74 (BP Location: Right Arm, Cuff Size: Large)   Pulse 96   Ht 5' 5 (1.651 m)   Wt 185 lb (83.9 kg)   SpO2 99%   BMI 30.79 kg/m   Physical Exam Constitutional:      General: She is not in acute distress.    Appearance: She is well-developed.  HENT:     Head: Normocephalic and atraumatic.  Eyes:     Conjunctiva/sclera: Conjunctivae normal.  Cardiovascular:     Rate and Rhythm: Normal rate and regular rhythm.     Heart sounds: Normal heart sounds.  Pulmonary:     Effort: Pulmonary effort is normal.     Breath sounds: Normal breath sounds.  Neurological:     General: No focal deficit present.     Mental Status: She is alert.  Psychiatric:        Behavior: Behavior normal.    Results for orders placed or performed in visit on 06/01/24  HM PAP SMEAR   Collection Time: 05/12/20  8:43 AM  Result Value Ref Range   HM Pap smear  NILM   HM MAMMOGRAPHY   Collection Time: 07/04/23  8:46 AM  Result Value Ref Range   HM Mammogram 0-4 Bi-Rad 0-4 Bi-Rad, Self Reported Normal    Diagnoses and Orders:   1. Other fatigue   2. Attention deficit hyperactivity disorder (ADHD), predominantly inattentive type   3. Perimenopausal vasomotor symptoms   4. Restless leg syndrome   5. Postoperative hypothyroidism   6. Psychophysiological insomnia   7. Class 1 obesity with serious comorbidity and body mass  index (BMI) of 30.0 to 30.9 in adult, unspecified obesity type    Meds ordered this encounter  Medications   semaglutide -weight management (WEGOVY ) 2.4 MG/0.75ML SOAJ SQ injection    Sig: Inject 2.4mg  under the skin once weekly.    Dispense:  3 mL    Refill:  2   lisdexamfetamine  (VYVANSE ) 60 MG capsule    Sig: Take 1 capsule (60 mg total) by mouth in the morning.    Dispense:  30 capsule    Refill:  0   SYNTHROID  137 MCG tablet    Sig: Take 1 tablet (137 mcg total) by mouth every morning on an empty stomach.    Dispense:  90 tablet    Refill:  3   predniSONE  (DELTASONE ) 10 MG tablet    Sig: Take 6 tablets by mouth on day 1 then decrease by 1 tablet daily until finished (6-5-4-3-2-1)    Dispense:  21 tablet    Refill:  1   estradiol  (CLIMARA  - DOSED IN MG/24 HR) 0.025 mg/24hr patch    Sig: Place 1 patch (0.025 mg total) onto the skin once a week.    Dispense:  4 patch    Refill:  12   progesterone  (PROMETRIUM ) 100 MG capsule    Sig: Take 1 capsule (100 mg total) by mouth daily.    Dispense:  30 capsule    Refill:  3   Assessment & Plan:   Assessment and Plan Assessment & Plan Discussed family history of breast cancer, dense breast tissue, and benign biopsy. Reviewed HRT risks and alternative treatments for menopausal symptoms. - Continue annual mammograms. - Consider trial of estrogen, progesterone , and testosterone for menopausal symptoms. - Follow up on genetic testing for breast cancer risk. - Consider breast MRI for screening if HRT is initiated.  Menopausal symptoms Symptoms include low energy, restless leg syndrome, and night sweats. Current Ambien  use not providing restorative sleep. Discussed HRT and alternative treatments. - Discontinued Ambien . - Consider trial of estrogen, progesterone , and testosterone for symptom management. - Discuss alternative medications for hot flashes and vaginal dryness.  Postoperative hypothyroidism Managed with Synthroid . Recent  labs reviewed, no missed doses. - Continue Synthroid  137 mcg daily.  Attention deficit hyperactivity disorder, predominantly inattentive type Managed with Vyvanse . - Continue Vyvanse  60 mg daily.  Class 1 obesity with serious comorbidity and body mass index (BMI) of 30.0 to 30.9 in adult Weight management with Semaglutide . Discussed insurance requirements. Engaging in physical activity and dietary modifications. - Continue Semaglutide  (Wegovy ) 2.4 mg weekly. - Follow up on insurance requirements for weight management program. - Encouraged continued physical activity and dietary modifications.  Geni Shutter, DO, MS, FAAFP, Dipl. [facility] [address] [place], [address] Dept: [phone] Dept Fax: [phone]  Attestations:   Reviewed by clinician on day of visit: allergies, medications, problem list, medical history, surgical history, family history, social history, and previous encounter notes. Discussed the use of AI scribe software for clinical  note transcription with the patient, who gave verbal consent to proceed. As the patient's primary care physician, board-certified in Family Medicine and Obesity Medicine, I am providing ongoing, comprehensive obesity care based on the pillars of obesity medicine, including nutrition therapy, physical activity, behavioral modification, and pharmacologic treatment.  I personally spent a total of 45 minutes in the care of the patient today including preparing to see the patient, performing a medically appropriate exam/evaluation, counseling and educating, placing orders, and documenting clinical information in the EHR.       [1]  Outpatient Medications Prior to Visit  Medication Sig   Biotin 5 MG CAPS Take 1 capsule by mouth daily at 12 noon.   buPROPion  (WELLBUTRIN  XL) 150 MG 24 hr tablet Take 1 tablet (150 mg total) by mouth every morning.   celecoxib (CELEBREX) 200 MG capsule Take  200 mg by mouth 2 (two) times daily.   cholecalciferol (VITAMIN D3) 25 MCG (1000 UT) tablet Take 3,000 Units by mouth daily. 3000 units daily   folic acid  (FOLVITE ) 1 MG tablet Take 1 mg by mouth daily.   Golimumab  50 MG/0.5ML SOAJ Inject into the skin. 2 mg/kg  every 8 weeks infusion   loratadine (CLARITIN) 10 MG tablet Take 10 mg by mouth daily.   Magnesium 200 MG TABS Take 1 tablet by mouth daily.   Melatonin 5 MG TABS Take 1 tablet by mouth daily.   Multiple Vitamins-Minerals (MULTIVITAMIN WITH MINERALS) tablet Take 1 tablet by mouth daily.   zolpidem  (AMBIEN ) 5 MG tablet Take 1 tablet (5 mg total) by mouth at bedtime as needed.   [DISCONTINUED] lisdexamfetamine  (VYVANSE ) 60 MG capsule Take 1 capsule (60 mg total) by mouth in the morning.   [DISCONTINUED] semaglutide -weight management (WEGOVY ) 2.4 MG/0.75ML SOAJ SQ injection Inject 2.4mg  under the skin once weekly.   [DISCONTINUED] SYNTHROID  137 MCG tablet Take 1 tablet (137 mcg total) by mouth every morning on an empty stomach.   montelukast  (SINGULAIR ) 10 MG tablet Take 10 mg by mouth at bedtime.   SUMAtriptan (IMITREX) 50 MG tablet Take 50 mg by mouth daily.   No facility-administered medications prior to visit.  [2]  Allergies Allergen Reactions   Adalimumab     Other Reaction(s): migraines   Shellfish Allergy Nausea And Vomiting   Soy Allergy (Obsolete)    Sulfa Antibiotics Nausea And Vomiting   Allevyn Adhesive [Wound Dressings] Rash   "

## 2024-06-20 ENCOUNTER — Other Ambulatory Visit (HOSPITAL_COMMUNITY): Payer: Self-pay

## 2024-06-23 ENCOUNTER — Other Ambulatory Visit (HOSPITAL_COMMUNITY): Payer: Self-pay

## 2024-06-26 ENCOUNTER — Other Ambulatory Visit (HOSPITAL_COMMUNITY): Payer: Self-pay

## 2024-06-26 ENCOUNTER — Other Ambulatory Visit: Payer: Self-pay

## 2024-07-05 ENCOUNTER — Other Ambulatory Visit (HOSPITAL_COMMUNITY): Payer: Self-pay

## 2024-07-05 MED ORDER — AMOXICILLIN 875 MG PO TABS
875.0000 mg | ORAL_TABLET | Freq: Two times a day (BID) | ORAL | 0 refills | Status: AC
  Filled 2024-07-05: qty 20, 10d supply, fill #0

## 2024-07-05 NOTE — Telephone Encounter (Signed)
 Noted. Pt notified.

## 2024-07-06 ENCOUNTER — Other Ambulatory Visit (HOSPITAL_COMMUNITY): Payer: Self-pay

## 2024-07-09 ENCOUNTER — Other Ambulatory Visit (HOSPITAL_COMMUNITY): Payer: Self-pay

## 2024-07-10 ENCOUNTER — Ambulatory Visit: Admitting: Family Medicine

## 2024-07-17 ENCOUNTER — Other Ambulatory Visit: Payer: Self-pay | Admitting: Family Medicine

## 2024-07-17 DIAGNOSIS — F900 Attention-deficit hyperactivity disorder, predominantly inattentive type: Secondary | ICD-10-CM

## 2024-07-18 ENCOUNTER — Other Ambulatory Visit (HOSPITAL_COMMUNITY): Payer: Self-pay

## 2024-07-18 ENCOUNTER — Other Ambulatory Visit: Payer: Self-pay

## 2024-07-18 MED ORDER — LISDEXAMFETAMINE DIMESYLATE 60 MG PO CAPS
60.0000 mg | ORAL_CAPSULE | Freq: Every morning | ORAL | 0 refills | Status: AC
  Filled 2024-07-18: qty 30, 30d supply, fill #0

## 2024-07-19 ENCOUNTER — Other Ambulatory Visit (HOSPITAL_COMMUNITY): Payer: Self-pay

## 2024-07-19 ENCOUNTER — Encounter: Payer: Self-pay | Admitting: Family Medicine

## 2024-07-19 ENCOUNTER — Ambulatory Visit: Admitting: Family Medicine

## 2024-07-19 ENCOUNTER — Other Ambulatory Visit: Payer: Self-pay

## 2024-07-19 VITALS — BP 118/80 | HR 66 | Ht 66.0 in | Wt 189.2 lb

## 2024-07-19 DIAGNOSIS — Z23 Encounter for immunization: Secondary | ICD-10-CM

## 2024-07-19 DIAGNOSIS — Z6830 Body mass index (BMI) 30.0-30.9, adult: Secondary | ICD-10-CM

## 2024-07-19 DIAGNOSIS — E890 Postprocedural hypothyroidism: Secondary | ICD-10-CM

## 2024-07-19 DIAGNOSIS — N951 Menopausal and female climacteric states: Secondary | ICD-10-CM

## 2024-07-19 MED ORDER — ESTRADIOL 0.025 MG/24HR TD PTTW
1.0000 | MEDICATED_PATCH | TRANSDERMAL | 2 refills | Status: AC
  Filled 2024-07-19: qty 8, 28d supply, fill #0

## 2024-07-19 NOTE — Progress Notes (Incomplete)
 "    Patient Care Team: Prentiss Frieze, DO as PCP - General (Family Medicine) [name], MD as Consulting Physician (Rheumatology)  Weight Management:   Starting weight: 213 lbs.  Starting date: 07/13/2018.  Current weight: 189 lbs.  Weight change since last encounter: +5 lbs.  Weight change since first encounter: -24 lbs.  Total weight loss percentage: -11.27%.  Body Fat Percentage: 37.5%.   Body mass index is 30.54 kg/m.   Nutrition Plan: Eat protein with every meal/snack , Prioritize whole, minimally processed foods , Improve hydration, Mindful portions, and Nutrition consistency over perfection. Anti-obesity medications (including off-label): Wegovy . Reported side effects: None. Hunger is well controlled. Cravings are well controlled. Activity: Walking.  Patient is under the care of an Obesity Medicine-certified physician providing longitudinal care using a comprehensive, pillar-based obesity treatment model (nutrition therapy, physical activity counseling, behavioral modification, pharmacotherapy when indicated, and medical monitoring), consistent with standards outlined by the Obesity Medicine Association. Obesity is treated as a medical disease, not a cosmetic condition. No contraindications identified (no personal or family history of medullary thyroid  carcinoma or MEN2; no history of pancreatitis). Weight, BMI, waist circumference, blood pressure, relevant metabolic markers (as applicable), medication tolerability, and adherence will be monitored at regular follow-up intervals. Medication choice is supported by evidence demonstrating improvement in weight, cardiometabolic risk, and obesity-related complications.   Frieze Prentiss, DO, MS (Nutrition), FAAFP, Dipl. ABOM Fellow, American Academy of Family Physicians Diplomate, American Board of Obesity Medicine [facility] [address] [place], [place] [address] Dept:  [phone] Fax: [phone]  Diagnoses and Orders:   1. Perimenopausal vasomotor symptoms   2. Postoperative hypothyroidism   3. Need for HPV vaccine   4. Class 1 obesity with serious comorbidity and body mass index (BMI) of 30.0 to 30.9 in adult, unspecified obesity type    Meds ordered this encounter  Medications   estradiol  (VIVELLE -DOT) 0.025 MG/24HR    Sig: Place 1 patch onto the skin 2 (two) times a week.    Dispense:  8 patch    Refill:  2   Orders Placed This Encounter  Procedures   HPV 9-valent vaccine,Recombinat   Assessment & Plan:   Anastasija Anfinson was seen today for medical management of chronic issues.  Diagnoses and all orders for this visit:  Perimenopausal vasomotor symptoms Improvement in symptoms with estrogen patch. Mild skin irritation. Will change to twice weekly dosing.  -     estradiol  (VIVELLE -DOT) 0.025 MG/24HR; Place 1 patch onto the skin 2 (two) times a week.  Postoperative hypothyroidism Course: stable. Medication: levothyroxine . Plan: Patient was instructed not to take MVM or iron within 4 hours of taking thyroid  medications. The current medical regimen is effective;  continue present plan and medications.  Lab Results  Component Value Date   TSH 0.38 05/03/2024   Need for HPV vaccine -     HPV 9-valent vaccine,Recombinat  Class 1 obesity with serious comorbidity and body mass index (BMI) of 30.0 to 30.9 in adult, unspecified obesity type See above, Weight Management section.  Subjective:   Review of Systems: Negative, with the exception of above mentioned in HPI.  History:   Reviewed by clinician on day of visit: allergies, medications, problem list, medical history, surgical history, family history, social history, and previous encounter notes.  Medications:   Show/hide medication list[1] Allergies[2]  Physical Exam:   BP 118/80   Pulse 66   Ht 5' 6 (1.676 m)   Wt  189 lb 3.2 oz (85.8 kg)   SpO2 99%   BMI 30.54 kg/m    Physical Exam Constitutional:      General: She is not in acute distress.    Appearance: She is well-developed.  HENT:     Head: Normocephalic and atraumatic.  Eyes:     Conjunctiva/sclera: Conjunctivae normal.  Cardiovascular:     Rate and Rhythm: Normal rate and regular rhythm.     Heart sounds: Normal heart sounds.  Pulmonary:     Effort: Pulmonary effort is normal.     Breath sounds: Normal breath sounds.  Neurological:     General: No focal deficit present.     Mental Status: She is alert.  Psychiatric:        Behavior: Behavior normal.        [1]  Outpatient Medications Prior to Visit  Medication Sig   buPROPion  (WELLBUTRIN  XL) 150 MG 24 hr tablet Take 1 tablet (150 mg total) by mouth every morning.   celecoxib (CELEBREX) 200 MG capsule Take 200 mg by mouth 2 (two) times daily. 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 3000 units daily   folic acid  (FOLVITE ) 1 MG tablet Take 1 mg by mouth daily.   Golimumab  50 MG/0.5ML SOAJ Inject into the skin. 2 mg/kg  every 8 weeks infusion   lisdexamfetamine  (VYVANSE ) 60 MG capsule Take 1 capsule (60 mg total) by mouth in the morning.   loratadine (CLARITIN) 10 MG tablet Take 10 mg by mouth daily.   Magnesium 200 MG TABS Take 1 tablet by mouth daily.   Melatonin 5 MG TABS Take 1 tablet by mouth daily.   montelukast  (SINGULAIR ) 10 MG tablet Take 10 mg by mouth at bedtime. (Patient taking differently: Take 10 mg by mouth as needed.)   predniSONE  (DELTASONE ) 10 MG tablet Take 6 tablets by mouth on day 1 then decrease by 1 tablet daily until finished (6-5-4-3-2-1) (Patient taking differently: Take 10 mg by mouth as needed. Take 6 tablets by mouth on day 1 then decrease by 1 tablet daily until finished (6-5-4-3-2-1))   progesterone  (PROMETRIUM ) 100 MG capsule Take 1 capsule (100 mg total) by mouth daily.    semaglutide -weight management (WEGOVY ) 2.4 MG/0.75ML SOAJ SQ injection Inject 2.4mg  under the skin once weekly.   SUMAtriptan (IMITREX) 50 MG tablet Take 50 mg by mouth daily. (Patient taking differently: Take 50 mg by mouth as needed for migraine.)   SYNTHROID  137 MCG tablet Take 1 tablet (137 mcg total) by mouth every morning on an empty stomach.   zolpidem  (AMBIEN ) 5 MG tablet Take 1 tablet (5 mg total) by mouth at bedtime as needed.   [DISCONTINUED] estradiol  (CLIMARA  - DOSED IN MG/24 HR) 0.025 mg/24hr patch Place 1 patch (0.025 mg total) onto the skin once a week.   Biotin 5 MG CAPS Take 1 capsule by mouth daily at 12 noon. (Patient not taking: Reported on 07/19/2024)   Multiple Vitamins-Minerals (MULTIVITAMIN WITH MINERALS) tablet Take 1 tablet by mouth daily. (Patient not taking: Reported on 07/19/2024)   No facility-administered medications prior to visit.  [2]  Allergies Allergen Reactions   Adalimumab     Other Reaction(s): migraines   Shellfish Allergy Nausea And Vomiting   Soy Allergy (Obsolete)    Sulfa Antibiotics Nausea And Vomiting   Allevyn Adhesive [Wound Dressings] Rash   "

## 2024-07-20 NOTE — Progress Notes (Signed)
 "    Patient Care Team: Briana Frieze, DO as PCP - General (Family Medicine) Briana Lynwood FALCON, MD as Consulting Physician (Rheumatology)  Weight Management:   Starting weight: 213 lbs.  Starting date: 07/13/2018.  Current weight: 189 lbs.  Weight change since last encounter: +5 lbs.  Weight change since first encounter: -24 lbs.  Total weight loss percentage: -11.27%.  Body Fat Percentage: 37.5%.   Body mass index is 30.54 kg/m.   Nutrition Plan: Eat protein with every meal/snack , Prioritize whole, minimally processed foods , Improve hydration, Mindful portions, and Nutrition consistency over perfection. Anti-obesity medications (including off-label): Wegovy . Reported side effects: None. Hunger is well controlled. Cravings are well controlled. Activity: Walking.  Patient is under the care of an Obesity Medicine-certified physician providing longitudinal care using a comprehensive, pillar-based obesity treatment model (nutrition therapy, physical activity counseling, behavioral modification, pharmacotherapy when indicated, and medical monitoring), consistent with standards outlined by the Obesity Medicine Association. Obesity is treated as a medical disease, not a cosmetic condition. No contraindications identified (no personal or family history of medullary thyroid  carcinoma or MEN2; no history of pancreatitis). Weight, BMI, waist circumference, blood pressure, relevant metabolic markers (as applicable), medication tolerability, and adherence will be monitored at regular follow-up intervals. Medication choice is supported by evidence demonstrating improvement in weight, cardiometabolic risk, and obesity-related complications.   Briana Prentiss, DO, MS (Nutrition), FAAFP, Dipl. ABOM Fellow, American Academy of Family Physicians Diplomate, American Board of Obesity Medicine [facility] [address] [place], [place] [address] Dept:  [phone] Fax: [phone]  Diagnoses and Orders:   1. Perimenopausal vasomotor symptoms   2. Postoperative hypothyroidism   3. Need for HPV vaccine   4. Class 1 obesity with serious comorbidity and body mass index (BMI) of 30.0 to 30.9 in adult, unspecified obesity type    Meds ordered this encounter  Medications   estradiol  (VIVELLE -DOT) 0.025 MG/24HR    Sig: Place 1 patch onto the skin 2 (two) times a week.    Dispense:  8 patch    Refill:  2   Orders Placed This Encounter  Procedures   HPV 9-valent vaccine,Recombinat   Assessment & Plan:   Briana Briana was seen today for medical management of chronic issues.  Diagnoses and all orders for this visit:  Perimenopausal vasomotor symptoms Improvement in symptoms with estrogen patch. Mild skin irritation. Will change to twice weekly dosing.  -     estradiol  (VIVELLE -DOT) 0.025 MG/24HR; Place 1 patch onto the skin 2 (two) times a week.  Postoperative hypothyroidism Course: stable. Medication: levothyroxine . Plan: Patient was instructed not to take MVM or iron within 4 hours of taking thyroid  medications. The current medical regimen is effective;  continue present plan and medications.  Lab Results  Component Value Date   TSH 0.38 05/03/2024   Need for HPV vaccine -     HPV 9-valent vaccine,Recombinat  Class 1 obesity with serious comorbidity and body mass index (BMI) of 30.0 to 30.9 in adult, unspecified obesity type See above, Weight Management section.  Subjective:   Review of Systems: Negative, with the exception of above mentioned in HPI.  History:   Reviewed by clinician on day of visit: allergies, medications, problem list, medical history, surgical history, family history, social history, and previous encounter notes.  Medications:   Show/hide medication list[1] Allergies[2]  Physical Exam:   BP 118/80   Pulse 66   Ht 5' 6 (1.676 m)   Wt  189 lb 3.2 oz (85.8 kg)   SpO2 99%   BMI 30.54 kg/m    Physical Exam Constitutional:      General: She is not in acute distress.    Appearance: She is well-developed.  HENT:     Head: Normocephalic and atraumatic.  Eyes:     Conjunctiva/sclera: Conjunctivae normal.  Cardiovascular:     Rate and Rhythm: Normal rate and regular rhythm.     Heart sounds: Normal heart sounds.  Pulmonary:     Effort: Pulmonary effort is normal.     Breath sounds: Normal breath sounds.  Neurological:     General: No focal deficit present.     Mental Status: She is alert.  Psychiatric:        Behavior: Behavior normal.        [1]  Outpatient Medications Prior to Visit  Medication Sig   buPROPion  (WELLBUTRIN  XL) 150 MG 24 hr tablet Take 1 tablet (150 mg total) by mouth every morning.   celecoxib (CELEBREX) 200 MG capsule Take 200 mg by mouth 2 (two) times daily. 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 3000 units daily   folic acid  (FOLVITE ) 1 MG tablet Take 1 mg by mouth daily.   Golimumab  50 MG/0.5ML SOAJ Inject into the skin. 2 mg/kg  every 8 weeks infusion   lisdexamfetamine  (VYVANSE ) 60 MG capsule Take 1 capsule (60 mg total) by mouth in the morning.   loratadine (CLARITIN) 10 MG tablet Take 10 mg by mouth daily.   Magnesium 200 MG TABS Take 1 tablet by mouth daily.   Melatonin 5 MG TABS Take 1 tablet by mouth daily.   montelukast  (SINGULAIR ) 10 MG tablet Take 10 mg by mouth at bedtime. (Patient taking differently: Take 10 mg by mouth as needed.)   predniSONE  (DELTASONE ) 10 MG tablet Take 6 tablets by mouth on day 1 then decrease by 1 tablet daily until finished (6-5-4-3-2-1) (Patient taking differently: Take 10 mg by mouth as needed. Take 6 tablets by mouth on day 1 then decrease by 1 tablet daily until finished (6-5-4-3-2-1))   progesterone  (PROMETRIUM ) 100 MG capsule Take 1 capsule (100 mg total) by mouth daily.    semaglutide -weight management (WEGOVY ) 2.4 MG/0.75ML SOAJ SQ injection Inject 2.4mg  under the skin once weekly.   SUMAtriptan (IMITREX) 50 MG tablet Take 50 mg by mouth daily. (Patient taking differently: Take 50 mg by mouth as needed for migraine.)   SYNTHROID  137 MCG tablet Take 1 tablet (137 mcg total) by mouth every morning on an empty stomach.   zolpidem  (AMBIEN ) 5 MG tablet Take 1 tablet (5 mg total) by mouth at bedtime as needed.   [DISCONTINUED] estradiol  (CLIMARA  - DOSED IN MG/24 HR) 0.025 mg/24hr patch Place 1 patch (0.025 mg total) onto the skin once a week.   Biotin 5 MG CAPS Take 1 capsule by mouth daily at 12 noon. (Patient not taking: Reported on 07/19/2024)   Multiple Vitamins-Minerals (MULTIVITAMIN WITH MINERALS) tablet Take 1 tablet by mouth daily. (Patient not taking: Reported on 07/19/2024)   No facility-administered medications prior to visit.  [2]  Allergies Allergen Reactions   Adalimumab     Other Reaction(s): migraines   Shellfish Allergy Nausea And Vomiting   Soy Allergy (Obsolete)    Sulfa Antibiotics Nausea And Vomiting   Allevyn Adhesive [Wound Dressings] Rash   "

## 2024-08-21 ENCOUNTER — Ambulatory Visit: Admitting: Family Medicine
